# Patient Record
Sex: Female | Born: 1955 | Race: White | Hispanic: No | Marital: Married | Smoking: Never smoker
Health system: Southern US, Community
[De-identification: ages and names within clinical notes are randomized; demographics above are authoritative.]

## PROBLEM LIST (undated history)

## (undated) DIAGNOSIS — N3090 Cystitis, unspecified without hematuria: Secondary | ICD-10-CM

## (undated) DIAGNOSIS — Z860100 Personal history of colon polyps, unspecified: Secondary | ICD-10-CM

## (undated) DIAGNOSIS — N63 Unspecified lump in unspecified breast: Secondary | ICD-10-CM

## (undated) DIAGNOSIS — I10 Essential (primary) hypertension: Secondary | ICD-10-CM

## (undated) DIAGNOSIS — E669 Obesity, unspecified: Secondary | ICD-10-CM

## (undated) DIAGNOSIS — Z853 Personal history of malignant neoplasm of breast: Secondary | ICD-10-CM

## (undated) DIAGNOSIS — M419 Scoliosis, unspecified: Secondary | ICD-10-CM

## (undated) DIAGNOSIS — C50919 Malignant neoplasm of unspecified site of unspecified female breast: Secondary | ICD-10-CM

## (undated) DIAGNOSIS — Z1211 Encounter for screening for malignant neoplasm of colon: Secondary | ICD-10-CM

## (undated) HISTORY — DX: Encounter for screening for malignant neoplasm of colon: Z12.11

## (undated) HISTORY — DX: Unspecified lump in unspecified breast: N63.0

## (undated) HISTORY — DX: Personal history of colon polyps, unspecified: Z86.0100

## (undated) HISTORY — DX: Malignant neoplasm of unspecified site of unspecified female breast: C50.919

## (undated) HISTORY — PX: MASTECTOMY: SHX3

## (undated) HISTORY — DX: Cystitis, unspecified without hematuria: N30.90

## (undated) HISTORY — DX: Obesity, unspecified: E66.9

## (undated) HISTORY — PX: BACK SURGERY: SHX140

## (undated) HISTORY — DX: Personal history of malignant neoplasm of breast: Z85.3

## (undated) HISTORY — DX: Scoliosis, unspecified: M41.9

## (undated) HISTORY — DX: Personal history of colonic polyps: Z86.010

## (undated) HISTORY — PX: TOE SURGERY: SHX1073

## (undated) HISTORY — DX: Essential (primary) hypertension: I10

---

## 1996-11-10 HISTORY — PX: ABDOMINAL HYSTERECTOMY: SHX81

## 2006-05-07 ENCOUNTER — Ambulatory Visit: Payer: Self-pay | Admitting: Family Medicine

## 2006-06-22 ENCOUNTER — Ambulatory Visit: Payer: Self-pay | Admitting: Gastroenterology

## 2007-02-15 ENCOUNTER — Ambulatory Visit: Payer: Self-pay | Admitting: Family Medicine

## 2008-04-17 ENCOUNTER — Ambulatory Visit: Payer: Self-pay | Admitting: Family Medicine

## 2008-06-28 ENCOUNTER — Ambulatory Visit: Payer: Self-pay

## 2008-11-10 HISTORY — PX: COLONOSCOPY: SHX174

## 2008-11-10 HISTORY — PX: OTHER SURGICAL HISTORY: SHX169

## 2009-05-30 ENCOUNTER — Ambulatory Visit: Payer: Self-pay | Admitting: Gastroenterology

## 2009-06-08 ENCOUNTER — Ambulatory Visit: Payer: Self-pay | Admitting: Family Medicine

## 2010-10-26 ENCOUNTER — Ambulatory Visit: Payer: Self-pay | Admitting: Internal Medicine

## 2010-11-10 DIAGNOSIS — N3090 Cystitis, unspecified without hematuria: Secondary | ICD-10-CM

## 2010-11-10 DIAGNOSIS — C50919 Malignant neoplasm of unspecified site of unspecified female breast: Secondary | ICD-10-CM

## 2010-11-10 HISTORY — DX: Malignant neoplasm of unspecified site of unspecified female breast: C50.919

## 2010-11-10 HISTORY — PX: PORTACATH PLACEMENT: SHX2246

## 2010-11-10 HISTORY — DX: Cystitis, unspecified without hematuria: N30.90

## 2010-11-10 HISTORY — PX: OTHER SURGICAL HISTORY: SHX169

## 2010-12-24 ENCOUNTER — Ambulatory Visit: Payer: Self-pay | Admitting: *Deleted

## 2011-06-11 ENCOUNTER — Ambulatory Visit: Payer: Self-pay | Admitting: Oncology

## 2011-07-02 ENCOUNTER — Ambulatory Visit: Payer: Self-pay | Admitting: *Deleted

## 2011-07-16 ENCOUNTER — Ambulatory Visit: Payer: Self-pay | Admitting: Oncology

## 2011-07-21 ENCOUNTER — Ambulatory Visit: Payer: Self-pay | Admitting: Oncology

## 2011-07-25 ENCOUNTER — Ambulatory Visit: Payer: Self-pay | Admitting: General Surgery

## 2011-08-11 ENCOUNTER — Ambulatory Visit: Payer: Self-pay | Admitting: Oncology

## 2011-09-11 ENCOUNTER — Ambulatory Visit: Payer: Self-pay | Admitting: Oncology

## 2011-10-11 ENCOUNTER — Ambulatory Visit: Payer: Self-pay | Admitting: Oncology

## 2011-11-11 ENCOUNTER — Ambulatory Visit: Payer: Self-pay | Admitting: Oncology

## 2011-11-14 LAB — CBC CANCER CENTER
Basophil #: 0 x10 3/mm (ref 0.0–0.1)
Eosinophil %: 2.1 %
HCT: 31.8 % — ABNORMAL LOW (ref 35.0–47.0)
Lymphocyte #: 0.7 x10 3/mm — ABNORMAL LOW (ref 1.0–3.6)
MCHC: 34.1 g/dL (ref 32.0–36.0)
MCV: 93.3 fL (ref 80–100)
Monocyte #: 0.5 x10 3/mm (ref 0.0–0.7)
Monocyte %: 8.1 %
Neutrophil #: 4.6 x10 3/mm (ref 1.4–6.5)
Platelet: 280 x10 3/mm (ref 150–440)
RBC: 3.41 10*6/uL — ABNORMAL LOW (ref 3.80–5.20)
RDW: 16.7 % — ABNORMAL HIGH (ref 11.5–14.5)
WBC: 5.9 x10 3/mm (ref 3.6–11.0)

## 2011-11-21 LAB — CBC CANCER CENTER
Basophil #: 0 x10 3/mm (ref 0.0–0.1)
Basophil %: 0.5 %
Eosinophil #: 0.1 x10 3/mm (ref 0.0–0.7)
Eosinophil %: 1.7 %
HCT: 31.5 % — ABNORMAL LOW (ref 35.0–47.0)
HGB: 10.5 g/dL — ABNORMAL LOW (ref 12.0–16.0)
MCH: 31.6 pg (ref 26.0–34.0)
MCV: 94.4 fL (ref 80–100)
Monocyte #: 0.5 x10 3/mm (ref 0.0–0.7)
Monocyte %: 8.7 %
Neutrophil #: 4.6 x10 3/mm (ref 1.4–6.5)
Platelet: 286 x10 3/mm (ref 150–440)
RBC: 3.33 10*6/uL — ABNORMAL LOW (ref 3.80–5.20)
WBC: 6.2 x10 3/mm (ref 3.6–11.0)

## 2011-11-28 LAB — CBC CANCER CENTER
Basophil #: 0 x10 3/mm (ref 0.0–0.1)
Basophil %: 0.4 %
Eosinophil #: 0.1 x10 3/mm (ref 0.0–0.7)
HCT: 32 % — ABNORMAL LOW (ref 35.0–47.0)
HGB: 10.9 g/dL — ABNORMAL LOW (ref 12.0–16.0)
Lymphocyte %: 16.6 %
MCH: 32.1 pg (ref 26.0–34.0)
MCHC: 33.9 g/dL (ref 32.0–36.0)
Monocyte #: 0.5 x10 3/mm (ref 0.0–0.7)
Monocyte %: 8 %
Neutrophil #: 5 x10 3/mm (ref 1.4–6.5)
Platelet: 279 x10 3/mm (ref 150–440)
RBC: 3.39 10*6/uL — ABNORMAL LOW (ref 3.80–5.20)
RDW: 16.6 % — ABNORMAL HIGH (ref 11.5–14.5)

## 2011-12-05 LAB — CBC CANCER CENTER
Basophil #: 0 x10 3/mm (ref 0.0–0.1)
Basophil %: 0.3 %
Eosinophil %: 1.1 %
HGB: 10.5 g/dL — ABNORMAL LOW (ref 12.0–16.0)
Lymphocyte #: 0.7 x10 3/mm — ABNORMAL LOW (ref 1.0–3.6)
Lymphocyte %: 11.4 %
MCHC: 34 g/dL (ref 32.0–36.0)
Monocyte #: 0.4 x10 3/mm (ref 0.0–0.7)
Neutrophil %: 80.7 %
RDW: 16.5 % — ABNORMAL HIGH (ref 11.5–14.5)

## 2011-12-12 ENCOUNTER — Ambulatory Visit: Payer: Self-pay | Admitting: Oncology

## 2011-12-12 LAB — CBC CANCER CENTER
Basophil #: 0 x10 3/mm (ref 0.0–0.1)
Basophil %: 0.6 %
HCT: 32.5 % — ABNORMAL LOW (ref 35.0–47.0)
HGB: 10.9 g/dL — ABNORMAL LOW (ref 12.0–16.0)
Lymphocyte #: 0.8 x10 3/mm — ABNORMAL LOW (ref 1.0–3.6)
Lymphocyte %: 13.3 %
MCH: 31.5 pg (ref 26.0–34.0)
MCHC: 33.4 g/dL (ref 32.0–36.0)
Monocyte #: 0.5 x10 3/mm (ref 0.0–0.7)
Monocyte %: 8.7 %
Neutrophil #: 4.9 x10 3/mm (ref 1.4–6.5)
Platelet: 274 x10 3/mm (ref 150–440)
RBC: 3.45 10*6/uL — ABNORMAL LOW (ref 3.80–5.20)
RDW: 17.1 % — ABNORMAL HIGH (ref 11.5–14.5)
WBC: 6.3 x10 3/mm (ref 3.6–11.0)

## 2011-12-19 LAB — CBC CANCER CENTER
Basophil #: 0 x10 3/mm (ref 0.0–0.1)
Basophil %: 0.5 %
HCT: 34.2 % — ABNORMAL LOW (ref 35.0–47.0)
HGB: 11.4 g/dL — ABNORMAL LOW (ref 12.0–16.0)
MCH: 31.3 pg (ref 26.0–34.0)
MCHC: 33.3 g/dL (ref 32.0–36.0)
Neutrophil #: 4.8 x10 3/mm (ref 1.4–6.5)
RDW: 16.9 % — ABNORMAL HIGH (ref 11.5–14.5)

## 2012-01-09 ENCOUNTER — Ambulatory Visit: Payer: Self-pay | Admitting: Oncology

## 2012-01-13 ENCOUNTER — Ambulatory Visit: Payer: Self-pay | Admitting: Unknown Physician Specialty

## 2012-01-19 ENCOUNTER — Ambulatory Visit: Payer: Self-pay | Admitting: General Surgery

## 2012-01-21 LAB — PATHOLOGY REPORT

## 2012-02-09 ENCOUNTER — Ambulatory Visit: Payer: Self-pay | Admitting: Oncology

## 2012-02-20 LAB — CBC CANCER CENTER
Basophil #: 0 x10 3/mm (ref 0.0–0.1)
Basophil %: 0.7 %
Eosinophil %: 1.9 %
HGB: 11.1 g/dL — ABNORMAL LOW (ref 12.0–16.0)
Lymphocyte #: 1.2 x10 3/mm (ref 1.0–3.6)
Lymphocyte %: 18.4 %
MCH: 30.8 pg (ref 26.0–34.0)
MCHC: 33.6 g/dL (ref 32.0–36.0)
MCV: 92 fL (ref 80–100)
Monocyte %: 6.5 %
Platelet: 215 x10 3/mm (ref 150–440)
RDW: 15.5 % — ABNORMAL HIGH (ref 11.5–14.5)
WBC: 6.6 x10 3/mm (ref 3.6–11.0)

## 2012-02-20 LAB — COMPREHENSIVE METABOLIC PANEL WITH GFR
Albumin: 3.6 g/dL
Alkaline Phosphatase: 98 U/L
Anion Gap: 9
BUN: 18 mg/dL
Bilirubin,Total: 0.5 mg/dL
Calcium, Total: 9 mg/dL
Chloride: 106 mmol/L
Co2: 26 mmol/L
Creatinine: 0.8 mg/dL
EGFR (African American): >60
EGFR (Non-African Amer.): >60
Glucose: 114 mg/dL — ABNORMAL HIGH
Osmolality: 284
Potassium: 3.6 mmol/L
SGOT(AST): 25 U/L
SGPT (ALT): 35 U/L
Sodium: 141 mmol/L
Total Protein: 7 g/dL

## 2012-03-10 ENCOUNTER — Ambulatory Visit: Payer: Self-pay | Admitting: Oncology

## 2012-03-10 ENCOUNTER — Ambulatory Visit: Payer: Self-pay | Admitting: Radiation Oncology

## 2012-03-11 LAB — CBC CANCER CENTER
Basophil #: 0.1 x10 3/mm (ref 0.0–0.1)
Basophil %: 0.8 %
Eosinophil #: 0.1 x10 3/mm (ref 0.0–0.7)
Eosinophil %: 1.8 %
HCT: 38.2 % (ref 35.0–47.0)
HGB: 12.5 g/dL (ref 12.0–16.0)
Lymphocyte #: 0.9 x10 3/mm — ABNORMAL LOW (ref 1.0–3.6)
Lymphocyte %: 12.7 %
MCH: 29.9 pg (ref 26.0–34.0)
MCHC: 32.7 g/dL (ref 32.0–36.0)
MCV: 92 fL (ref 80–100)
Monocyte #: 0.6 x10 3/mm (ref 0.2–0.9)
Monocyte %: 8.4 %
Neutrophil #: 5.3 x10 3/mm (ref 1.4–6.5)
Neutrophil %: 76.3 %
Platelet: 225 x10 3/mm (ref 150–440)
RBC: 4.17 10*6/uL (ref 3.80–5.20)
RDW: 15 % — ABNORMAL HIGH (ref 11.5–14.5)
WBC: 7 x10 3/mm (ref 3.6–11.0)

## 2012-03-18 LAB — CBC CANCER CENTER
Basophil %: 0.8 %
Eosinophil #: 0.1 x10 3/mm (ref 0.0–0.7)
Eosinophil %: 1.4 %
HGB: 12.3 g/dL (ref 12.0–16.0)
Lymphocyte #: 0.7 x10 3/mm — ABNORMAL LOW (ref 1.0–3.6)
Lymphocyte %: 9.4 %
MCH: 29.8 pg (ref 26.0–34.0)
MCHC: 32.4 g/dL (ref 32.0–36.0)
Monocyte #: 0.6 x10 3/mm (ref 0.2–0.9)
Monocyte %: 7.8 %
Neutrophil %: 80.6 %
Platelet: 231 x10 3/mm (ref 150–440)
RDW: 14.8 % — ABNORMAL HIGH (ref 11.5–14.5)

## 2012-03-25 LAB — CBC CANCER CENTER
Basophil #: 0 x10 3/mm (ref 0.0–0.1)
Basophil %: 0.4 %
Eosinophil #: 0.1 x10 3/mm (ref 0.0–0.7)
Eosinophil %: 1.8 %
Lymphocyte %: 9.4 %
MCV: 91 fL (ref 80–100)
Monocyte %: 8.9 %
Neutrophil #: 4.9 x10 3/mm (ref 1.4–6.5)
Platelet: 192 x10 3/mm (ref 150–440)
RBC: 4 10*6/uL (ref 3.80–5.20)

## 2012-04-01 LAB — CBC CANCER CENTER
Basophil #: 0 x10 3/mm (ref 0.0–0.1)
HCT: 35.9 % (ref 35.0–47.0)
HGB: 11.8 g/dL — ABNORMAL LOW (ref 12.0–16.0)
Lymphocyte #: 0.3 x10 3/mm — ABNORMAL LOW (ref 1.0–3.6)
MCH: 29.8 pg (ref 26.0–34.0)
MCHC: 32.9 g/dL (ref 32.0–36.0)
MCV: 91 fL (ref 80–100)
Neutrophil #: 3.7 x10 3/mm (ref 1.4–6.5)
Neutrophil %: 77.5 %
RBC: 3.96 10*6/uL (ref 3.80–5.20)

## 2012-04-08 LAB — CBC CANCER CENTER
Basophil #: 0 x10 3/mm (ref 0.0–0.1)
Eosinophil %: 1.4 %
HCT: 36 % (ref 35.0–47.0)
HGB: 11.8 g/dL — ABNORMAL LOW (ref 12.0–16.0)
MCH: 29.8 pg (ref 26.0–34.0)
Neutrophil %: 74.9 %
RBC: 3.96 10*6/uL (ref 3.80–5.20)
RDW: 15.8 % — ABNORMAL HIGH (ref 11.5–14.5)
WBC: 5.7 x10 3/mm (ref 3.6–11.0)

## 2012-04-10 ENCOUNTER — Ambulatory Visit: Payer: Self-pay | Admitting: Oncology

## 2012-04-10 ENCOUNTER — Ambulatory Visit: Payer: Self-pay | Admitting: Radiation Oncology

## 2012-04-15 LAB — CBC CANCER CENTER
Basophil %: 1.8 %
Eosinophil #: 0.1 x10 3/mm (ref 0.0–0.7)
Eosinophil %: 1.2 %
HCT: 36.9 % (ref 35.0–47.0)
Lymphocyte #: 0.4 x10 3/mm — ABNORMAL LOW (ref 1.0–3.6)
MCV: 91 fL (ref 80–100)
Neutrophil #: 4.5 x10 3/mm (ref 1.4–6.5)
Neutrophil %: 80.2 %
Platelet: 181 x10 3/mm (ref 150–440)
RBC: 4.07 10*6/uL (ref 3.80–5.20)
RDW: 15.8 % — ABNORMAL HIGH (ref 11.5–14.5)
WBC: 5.6 x10 3/mm (ref 3.6–11.0)

## 2012-04-22 LAB — CBC CANCER CENTER
Basophil #: 0 x10 3/mm (ref 0.0–0.1)
Eosinophil #: 0.1 x10 3/mm (ref 0.0–0.7)
HCT: 35.9 % (ref 35.0–47.0)
Lymphocyte #: 0.4 x10 3/mm — ABNORMAL LOW (ref 1.0–3.6)
Lymphocyte %: 7.9 %
MCHC: 32.8 g/dL (ref 32.0–36.0)
MCV: 90 fL (ref 80–100)
Neutrophil %: 79.7 %
Platelet: 180 x10 3/mm (ref 150–440)
RBC: 3.97 10*6/uL (ref 3.80–5.20)
RDW: 15.8 % — ABNORMAL HIGH (ref 11.5–14.5)
WBC: 4.9 x10 3/mm (ref 3.6–11.0)

## 2012-05-10 ENCOUNTER — Ambulatory Visit: Payer: Self-pay | Admitting: Radiation Oncology

## 2012-05-10 ENCOUNTER — Ambulatory Visit: Payer: Self-pay | Admitting: Oncology

## 2012-05-21 LAB — COMPREHENSIVE METABOLIC PANEL
Anion Gap: 7 (ref 7–16)
Calcium, Total: 9.2 mg/dL (ref 8.5–10.1)
Chloride: 104 mmol/L (ref 98–107)
EGFR (African American): >60
EGFR (Non-African Amer.): >60
Potassium: 3.7 mmol/L (ref 3.5–5.1)
SGOT(AST): 22 U/L (ref 15–37)
SGPT (ALT): 32 U/L

## 2012-05-21 LAB — CBC CANCER CENTER
Basophil #: 0 x10 3/mm (ref 0.0–0.1)
Eosinophil #: 0.1 x10 3/mm (ref 0.0–0.7)
Eosinophil %: 1.4 %
HCT: 35 % (ref 35.0–47.0)
Lymphocyte %: 10.1 %
Monocyte #: 0.5 x10 3/mm (ref 0.2–0.9)
Neutrophil %: 80.9 %
RDW: 16.4 % — ABNORMAL HIGH (ref 11.5–14.5)
WBC: 6.2 x10 3/mm (ref 3.6–11.0)

## 2012-05-22 LAB — CANCER ANTIGEN 27.29: CA 27.29: 13 U/mL (ref 0.0–38.6)

## 2012-06-10 ENCOUNTER — Ambulatory Visit: Payer: Self-pay | Admitting: Oncology

## 2012-07-06 ENCOUNTER — Ambulatory Visit: Payer: Self-pay | Admitting: General Surgery

## 2012-07-22 ENCOUNTER — Ambulatory Visit: Payer: Self-pay | Admitting: General Surgery

## 2012-07-26 ENCOUNTER — Ambulatory Visit: Payer: Self-pay | Admitting: Oncology

## 2012-08-04 ENCOUNTER — Ambulatory Visit: Payer: Self-pay | Admitting: General Surgery

## 2012-08-10 ENCOUNTER — Ambulatory Visit: Payer: Self-pay | Admitting: Oncology

## 2012-08-20 LAB — COMPREHENSIVE METABOLIC PANEL
Albumin: 3.5 g/dL (ref 3.4–5.0)
Alkaline Phosphatase: 98 U/L (ref 50–136)
BUN: 18 mg/dL (ref 7–18)
Bilirubin,Total: 0.5 mg/dL (ref 0.2–1.0)
Calcium, Total: 9.3 mg/dL (ref 8.5–10.1)
Chloride: 105 mmol/L (ref 98–107)
Co2: 30 mmol/L (ref 21–32)
Creatinine: 0.75 mg/dL (ref 0.60–1.30)
EGFR (African American): >60
EGFR (Non-African Amer.): >60
Osmolality: 280 (ref 275–301)
SGOT(AST): 20 U/L (ref 15–37)
SGPT (ALT): 33 U/L (ref 12–78)

## 2012-08-20 LAB — CBC CANCER CENTER
Basophil #: 0 x10 3/mm (ref 0.0–0.1)
Eosinophil #: 0.1 x10 3/mm (ref 0.0–0.7)
Eosinophil %: 1.3 %
Lymphocyte #: 0.6 x10 3/mm — ABNORMAL LOW (ref 1.0–3.6)
MCH: 32.2 pg (ref 26.0–34.0)
MCHC: 33.8 g/dL (ref 32.0–36.0)
MCV: 95 fL (ref 80–100)
Monocyte #: 0.5 x10 3/mm (ref 0.2–0.9)
Neutrophil %: 77.9 %
Platelet: 206 x10 3/mm (ref 150–440)
RDW: 14.8 % — ABNORMAL HIGH (ref 11.5–14.5)

## 2012-08-23 LAB — CANCER ANTIGEN 27.29: CA 27.29: 11.1 U/mL (ref 0.0–38.6)

## 2012-09-10 ENCOUNTER — Ambulatory Visit: Payer: Self-pay | Admitting: Oncology

## 2012-10-21 ENCOUNTER — Ambulatory Visit: Payer: Self-pay | Admitting: Oncology

## 2012-11-10 ENCOUNTER — Ambulatory Visit: Payer: Self-pay | Admitting: Oncology

## 2012-12-11 ENCOUNTER — Ambulatory Visit: Payer: Self-pay | Admitting: Oncology

## 2012-12-17 LAB — COMPREHENSIVE METABOLIC PANEL
Albumin: 3.5 g/dL (ref 3.4–5.0)
Alkaline Phosphatase: 116 U/L (ref 50–136)
Anion Gap: 7 (ref 7–16)
Bilirubin,Total: 0.5 mg/dL (ref 0.2–1.0)
Chloride: 105 mmol/L (ref 98–107)
EGFR (African American): >60
Glucose: 135 mg/dL — ABNORMAL HIGH (ref 65–99)
Osmolality: 284 (ref 275–301)
SGOT(AST): 20 U/L (ref 15–37)
Total Protein: 7 g/dL (ref 6.4–8.2)

## 2012-12-17 LAB — CBC CANCER CENTER
Basophil #: 0 x10 3/mm (ref 0.0–0.1)
Eosinophil #: 0.1 x10 3/mm (ref 0.0–0.7)
Eosinophil %: 2.4 %
HCT: 36.8 % (ref 35.0–47.0)
Lymphocyte #: 0.7 x10 3/mm — ABNORMAL LOW (ref 1.0–3.6)
MCH: 31.4 pg (ref 26.0–34.0)
MCHC: 34 g/dL (ref 32.0–36.0)
Monocyte %: 6.7 %
Neutrophil %: 78.5 %
Platelet: 205 x10 3/mm (ref 150–440)
RDW: 14.4 % (ref 11.5–14.5)

## 2012-12-18 ENCOUNTER — Encounter: Payer: Self-pay | Admitting: *Deleted

## 2012-12-18 DIAGNOSIS — E669 Obesity, unspecified: Secondary | ICD-10-CM | POA: Insufficient documentation

## 2012-12-18 DIAGNOSIS — M419 Scoliosis, unspecified: Secondary | ICD-10-CM | POA: Insufficient documentation

## 2012-12-18 DIAGNOSIS — N3090 Cystitis, unspecified without hematuria: Secondary | ICD-10-CM | POA: Insufficient documentation

## 2012-12-18 DIAGNOSIS — C50919 Malignant neoplasm of unspecified site of unspecified female breast: Secondary | ICD-10-CM | POA: Insufficient documentation

## 2012-12-18 DIAGNOSIS — I10 Essential (primary) hypertension: Secondary | ICD-10-CM | POA: Insufficient documentation

## 2012-12-18 DIAGNOSIS — N63 Unspecified lump in unspecified breast: Secondary | ICD-10-CM | POA: Insufficient documentation

## 2012-12-18 DIAGNOSIS — Z853 Personal history of malignant neoplasm of breast: Secondary | ICD-10-CM | POA: Insufficient documentation

## 2012-12-18 DIAGNOSIS — Z1211 Encounter for screening for malignant neoplasm of colon: Secondary | ICD-10-CM | POA: Insufficient documentation

## 2013-01-08 ENCOUNTER — Ambulatory Visit: Payer: Self-pay | Admitting: Oncology

## 2013-03-07 ENCOUNTER — Ambulatory Visit (INDEPENDENT_AMBULATORY_CARE_PROVIDER_SITE_OTHER): Payer: 59 | Admitting: General Surgery

## 2013-03-07 ENCOUNTER — Encounter: Payer: Self-pay | Admitting: General Surgery

## 2013-03-07 ENCOUNTER — Ambulatory Visit: Payer: Self-pay | Admitting: General Surgery

## 2013-03-07 ENCOUNTER — Other Ambulatory Visit: Payer: Self-pay | Admitting: *Deleted

## 2013-03-07 VITALS — BP 122/80 | HR 78 | Resp 18 | Ht 64.0 in | Wt 178.0 lb

## 2013-03-07 DIAGNOSIS — Z853 Personal history of malignant neoplasm of breast: Secondary | ICD-10-CM

## 2013-03-07 NOTE — Progress Notes (Signed)
The patient has been asked to return to the office in five months for a unilateral left breast diagnostic mammogram.

## 2013-03-07 NOTE — Progress Notes (Signed)
Patient ID: Melanie Jacobs, female   DOB: [birth year], 57 y.o.   MRN: [id number]  Chief Complaint  Patient presents with  . Follow-up    left breast ultrasound    HPI Melanie Jacobs is a 57 y.o. female who presents for a follow up left breast ultrasound. At prior visit the patient found a small mass located in the left breast described as pea size. She was diagnosed with right breast cancer in Sept 2012 and was treated with Right mastectomy , chemotherapy and radiation by Dr. Doylene Canning and Dr.Chrystal. She performs self breast exams and get regular mammograms done. No new problems with the breasts. She still has a little tenderness in the left breast. She states she has not noticed the small mass gettingsmaller since her last office visit. No known family history of breast problems.   HPI  Past Medical History  Diagnosis Date  . Breast cancer 2012    right breast  . Malignant neoplasm of breast (female), unspecified site   . Unspecified essential hypertension   . Cystitis 2012  . Personal history of malignant neoplasm of breast   . Lump or mass in breast   . Special screening for malignant neoplasms, colon   . Obesity, unspecified   . Personal history of colonic polyps   . Scoliosis     Past Surgical History  Procedure Laterality Date  . Abdominal hysterectomy  1998  . Colonoscopy  2010    Dr/ Niel Hummer  . Mastectomy Right   . Right breast biopsy   2012  . Portacath placement  2012  . Toe surgery      age 49 bilateral  . Back surgery  [phone]    due to scoliosis  . Arm surgery  2010    right arm due to nerve damage    Family History  Problem Relation Age of Onset  . Lung cancer Father     Social History History  Substance Use Topics  . Smoking status: Never Smoker   . Smokeless tobacco: Not on file  . Alcohol Use: No    No Known Allergies  Current Outpatient Prescriptions  Medication Sig Dispense Refill  . Calcium Carbonate-Vitamin D (CALTRATE 600+D PO) Take by  mouth.      . docusate sodium (COLACE) 100 MG capsule Take 100 mg by mouth as needed for constipation.      Marland Kitchen exemestane (AROMASIN) 25 MG tablet Take 25 mg by mouth daily.      . fish oil-omega-3 fatty acids 1000 MG capsule Take 1 g by mouth daily.      Marland Kitchen FLUoxetine (PROZAC) 20 MG capsule Take 20 mg by mouth 3 (three) times daily.      Marland Kitchen HYDROcodone-acetaminophen (VICODIN) 5-500 MG per tablet Take 1 tablet by mouth as needed for pain.      Marland Kitchen ibuprofen (ADVIL,MOTRIN) 200 MG tablet Take 200 mg by mouth every 6 (six) hours as needed for pain.      Marland Kitchen Omeprazole (PRILOSEC PO) Take 2 capsules by mouth daily.        No current facility-administered medications for this visit.    Review of Systems Review of Systems  Constitutional: Negative.   Respiratory: Negative.   Cardiovascular: Negative.     Blood pressure 122/80, pulse 78, resp. rate 18, height 5\' 4"  (1.626 m), weight 178 lb (80.74 kg), last menstrual period 11/10/1996.  Physical Exam Physical Exam  Constitutional: She appears well-developed and well-nourished.  Pulmonary/Chest: Left breast exhibits no  inverted nipple, no mass, no nipple discharge, no skin change and no tenderness.    RIght mastectomy site well healed.   Lymphadenopathy:    She has no cervical adenopathy.    She has no axillary adenopathy.    Data Reviewed nil  Assessment    Resolved left breast mass     Plan    Return to scheduled f/u        SANKAR,SEEPLAPUTHUR G 03/07/2013, 8:04 PM

## 2013-03-07 NOTE — Patient Instructions (Addendum)
Left breast diagnostic mammogram in September 2014.

## 2013-04-19 ENCOUNTER — Ambulatory Visit: Payer: Self-pay | Admitting: Oncology

## 2013-04-26 ENCOUNTER — Emergency Department: Payer: Self-pay | Admitting: Emergency Medicine

## 2013-04-26 ENCOUNTER — Ambulatory Visit: Payer: Self-pay | Admitting: Radiation Oncology

## 2013-04-26 LAB — URINALYSIS, COMPLETE
Glucose,UR: NEGATIVE mg/dL (ref 0–75)
Ketone: NEGATIVE
Nitrite: NEGATIVE
RBC,UR: 1 /HPF (ref 0–5)
Specific Gravity: 1.025 (ref 1.003–1.030)
WBC UR: 6 /HPF (ref 0–5)

## 2013-04-26 LAB — CBC
HCT: 38.5 % (ref 35.0–47.0)
MCH: 32.1 pg (ref 26.0–34.0)
MCV: 94 fL (ref 80–100)
Platelet: 223 10*3/uL (ref 150–440)
RBC: 4.09 10*6/uL (ref 3.80–5.20)
RDW: 16.2 % — ABNORMAL HIGH (ref 11.5–14.5)

## 2013-04-26 LAB — COMPREHENSIVE METABOLIC PANEL
Anion Gap: 8 (ref 7–16)
BUN: 26 mg/dL — ABNORMAL HIGH (ref 7–18)
Bilirubin,Total: 2.6 mg/dL — ABNORMAL HIGH (ref 0.2–1.0)
Calcium, Total: 9.8 mg/dL (ref 8.5–10.1)
Chloride: 103 mmol/L (ref 98–107)
Co2: 25 mmol/L (ref 21–32)
Glucose: 87 mg/dL (ref 65–99)
Potassium: 4.3 mmol/L (ref 3.5–5.1)
SGOT(AST): 291 U/L — ABNORMAL HIGH (ref 15–37)
Sodium: 136 mmol/L (ref 136–145)
Total Protein: 7.6 g/dL (ref 6.4–8.2)

## 2013-05-06 LAB — CBC CANCER CENTER
Basophil %: 2.8 %
Eosinophil #: 0 x10 3/mm (ref 0.0–0.7)
Eosinophil %: 0.3 %
HCT: 34 % — ABNORMAL LOW (ref 35.0–47.0)
Lymphocyte #: 0.5 x10 3/mm — ABNORMAL LOW (ref 1.0–3.6)
MCH: 32.3 pg (ref 26.0–34.0)
MCHC: 34.3 g/dL (ref 32.0–36.0)
MCV: 94 fL (ref 80–100)
Monocyte %: 27.7 %
Platelet: 137 x10 3/mm — ABNORMAL LOW (ref 150–440)
WBC: 1.1 x10 3/mm — CL (ref 3.6–11.0)

## 2013-05-06 LAB — COMPREHENSIVE METABOLIC PANEL
Albumin: 3 g/dL — ABNORMAL LOW (ref 3.4–5.0)
Alkaline Phosphatase: 200 U/L — ABNORMAL HIGH (ref 50–136)
Anion Gap: 13 (ref 7–16)
Bilirubin,Total: 1.3 mg/dL — ABNORMAL HIGH (ref 0.2–1.0)
Calcium, Total: 8.9 mg/dL (ref 8.5–10.1)
Creatinine: 0.8 mg/dL (ref 0.60–1.30)
EGFR (African American): >60
EGFR (Non-African Amer.): >60
Glucose: 153 mg/dL — ABNORMAL HIGH (ref 65–99)
Osmolality: 277 (ref 275–301)
Potassium: 4 mmol/L (ref 3.5–5.1)
SGOT(AST): 331 U/L — ABNORMAL HIGH (ref 15–37)
SGPT (ALT): 113 U/L — ABNORMAL HIGH (ref 12–78)
Sodium: 137 mmol/L (ref 136–145)
Total Protein: 7.2 g/dL (ref 6.4–8.2)

## 2013-05-10 ENCOUNTER — Ambulatory Visit: Payer: Self-pay | Admitting: Oncology

## 2013-06-06 ENCOUNTER — Telehealth: Payer: Self-pay | Admitting: General Surgery

## 2013-06-06 NOTE — Telephone Encounter (Signed)
PATIENT CAME IN TODAY NEEDING A PRESCRIPTION FOR PROSTHESIS & BRA'S.PLEASE SEND TO [facility].

## 2013-06-09 LAB — CBC CANCER CENTER
Basophil #: 0 x10 3/mm (ref 0.0–0.1)
Eosinophil #: 0 x10 3/mm (ref 0.0–0.7)
HCT: 32.3 % — ABNORMAL LOW (ref 35.0–47.0)
HGB: 11.4 g/dL — ABNORMAL LOW (ref 12.0–16.0)
Lymphocyte %: 13.2 %
MCH: 34.8 pg — ABNORMAL HIGH (ref 26.0–34.0)
MCHC: 35.4 g/dL (ref 32.0–36.0)
Monocyte #: 0.3 x10 3/mm (ref 0.2–0.9)
Monocyte %: 5.9 %
Neutrophil #: 3.9 x10 3/mm (ref 1.4–6.5)
Neutrophil %: 80.1 %
Platelet: 119 x10 3/mm — ABNORMAL LOW (ref 150–440)
RDW: 18.3 % — ABNORMAL HIGH (ref 11.5–14.5)
WBC: 4.8 x10 3/mm (ref 3.6–11.0)

## 2013-06-09 LAB — COMPREHENSIVE METABOLIC PANEL
Alkaline Phosphatase: 120 U/L (ref 50–136)
Chloride: 104 mmol/L (ref 98–107)
Co2: 28 mmol/L (ref 21–32)
EGFR (Non-African Amer.): >60
Potassium: 3.9 mmol/L (ref 3.5–5.1)
Sodium: 139 mmol/L (ref 136–145)

## 2013-06-10 ENCOUNTER — Ambulatory Visit: Payer: Self-pay | Admitting: Oncology

## 2013-06-24 LAB — COMPREHENSIVE METABOLIC PANEL
Albumin: 3.3 g/dL — ABNORMAL LOW (ref 3.4–5.0)
BUN: 18 mg/dL (ref 7–18)
Calcium, Total: 8.7 mg/dL (ref 8.5–10.1)
Chloride: 106 mmol/L (ref 98–107)
Co2: 25 mmol/L (ref 21–32)
Creatinine: 0.71 mg/dL (ref 0.60–1.30)
EGFR (African American): >60
EGFR (Non-African Amer.): >60
Glucose: 125 mg/dL — ABNORMAL HIGH (ref 65–99)
Potassium: 4.2 mmol/L (ref 3.5–5.1)
SGPT (ALT): 44 U/L (ref 12–78)
Total Protein: 6.9 g/dL (ref 6.4–8.2)

## 2013-06-24 LAB — CBC CANCER CENTER
Eosinophil #: 0 x10 3/mm (ref 0.0–0.7)
HCT: 33.4 % — ABNORMAL LOW (ref 35.0–47.0)
HGB: 11.5 g/dL — ABNORMAL LOW (ref 12.0–16.0)
Lymphocyte #: 0.5 x10 3/mm — ABNORMAL LOW (ref 1.0–3.6)
Lymphocyte %: 8.1 %
MCHC: 34.4 g/dL (ref 32.0–36.0)
MCV: 100 fL (ref 80–100)
Monocyte #: 0.4 x10 3/mm (ref 0.2–0.9)
Monocyte %: 5.5 %
Neutrophil #: 5.4 x10 3/mm (ref 1.4–6.5)
Platelet: 123 x10 3/mm — ABNORMAL LOW (ref 150–440)
RBC: 3.35 10*6/uL — ABNORMAL LOW (ref 3.80–5.20)
RDW: 19.3 % — ABNORMAL HIGH (ref 11.5–14.5)

## 2013-07-01 LAB — CBC CANCER CENTER
Basophil #: 0.1 x10 3/mm (ref 0.0–0.1)
Eosinophil %: 0.1 %
HCT: 33.2 % — ABNORMAL LOW (ref 35.0–47.0)
Lymphocyte #: 0.9 x10 3/mm — ABNORMAL LOW (ref 1.0–3.6)
Lymphocyte %: 10.3 %
MCH: 34.1 pg — ABNORMAL HIGH (ref 26.0–34.0)
MCHC: 34.1 g/dL (ref 32.0–36.0)
MCV: 100 fL (ref 80–100)
Monocyte #: 1.2 x10 3/mm — ABNORMAL HIGH (ref 0.2–0.9)
Platelet: 89 x10 3/mm — ABNORMAL LOW (ref 150–440)
RBC: 3.32 10*6/uL — ABNORMAL LOW (ref 3.80–5.20)

## 2013-07-11 ENCOUNTER — Ambulatory Visit: Payer: Self-pay | Admitting: Oncology

## 2013-07-11 ENCOUNTER — Ambulatory Visit: Payer: Self-pay | Admitting: *Deleted

## 2013-07-27 ENCOUNTER — Ambulatory Visit: Payer: 59 | Admitting: General Surgery

## 2013-07-27 ENCOUNTER — Ambulatory Visit: Payer: Self-pay | Admitting: General Surgery

## 2013-07-28 ENCOUNTER — Encounter: Payer: Self-pay | Admitting: General Surgery

## 2013-08-02 NOTE — Progress Notes (Signed)
Quick Note:  Make sure the additional views are done prior to her office visit ______ 

## 2013-08-04 ENCOUNTER — Ambulatory Visit: Payer: 59 | Admitting: General Surgery

## 2013-08-08 ENCOUNTER — Ambulatory Visit: Payer: 59 | Admitting: General Surgery

## 2013-08-09 ENCOUNTER — Telehealth: Payer: Self-pay | Admitting: General Surgery

## 2013-08-09 NOTE — Telephone Encounter (Signed)
Pt had decided not to have additional views of her left breast. She now has liver mets and is undergoing further treatment.  At present there is no need for further assessment of her left breast.  Please inform mammography about this. Also she does not need f/u appt here at present.

## 2013-08-10 ENCOUNTER — Ambulatory Visit: Payer: Self-pay | Admitting: Oncology

## 2013-08-18 ENCOUNTER — Ambulatory Visit: Payer: 59 | Admitting: General Surgery

## 2013-09-10 ENCOUNTER — Ambulatory Visit: Payer: Self-pay | Admitting: Oncology

## 2013-10-10 ENCOUNTER — Ambulatory Visit: Payer: Self-pay | Admitting: Oncology

## 2013-10-21 LAB — CBC CANCER CENTER
Basophil %: 0.6 %
Eosinophil %: 2.3 %
HCT: 30.3 % — ABNORMAL LOW (ref 35.0–47.0)
HGB: 10.5 g/dL — ABNORMAL LOW (ref 12.0–16.0)
Lymphocyte #: 0.6 x10 3/mm — ABNORMAL LOW (ref 1.0–3.6)
MCH: 40.6 pg — ABNORMAL HIGH (ref 26.0–34.0)
MCHC: 34.6 g/dL (ref 32.0–36.0)
Monocyte #: 0.5 x10 3/mm (ref 0.2–0.9)
Monocyte %: 9.8 %
RBC: 2.58 10*6/uL — ABNORMAL LOW (ref 3.80–5.20)
RDW: 22.3 % — ABNORMAL HIGH (ref 11.5–14.5)

## 2013-10-21 LAB — COMPREHENSIVE METABOLIC PANEL
Albumin: 3.6 g/dL (ref 3.4–5.0)
Alkaline Phosphatase: 62 U/L
Anion Gap: 8 (ref 7–16)
BUN: 14 mg/dL (ref 7–18)
Calcium, Total: 8.8 mg/dL (ref 8.5–10.1)
Creatinine: 0.65 mg/dL (ref 0.60–1.30)
EGFR (Non-African Amer.): >60
SGPT (ALT): 41 U/L (ref 12–78)

## 2013-11-10 ENCOUNTER — Ambulatory Visit: Payer: Self-pay | Admitting: Oncology

## 2013-11-10 ENCOUNTER — Ambulatory Visit: Payer: Self-pay | Admitting: *Deleted

## 2013-11-25 LAB — COMPREHENSIVE METABOLIC PANEL
ALK PHOS: 58 U/L
ALT: 40 U/L (ref 12–78)
ANION GAP: 7 (ref 7–16)
Albumin: 3.8 g/dL (ref 3.4–5.0)
BUN: 19 mg/dL — ABNORMAL HIGH (ref 7–18)
Bilirubin,Total: 1.3 mg/dL — ABNORMAL HIGH (ref 0.2–1.0)
CHLORIDE: 102 mmol/L (ref 98–107)
CO2: 29 mmol/L (ref 21–32)
Calcium, Total: 8.9 mg/dL (ref 8.5–10.1)
Creatinine: 0.76 mg/dL (ref 0.60–1.30)
EGFR (African American): >60
EGFR (Non-African Amer.): >60
Glucose: 97 mg/dL (ref 65–99)
Osmolality: 278 (ref 275–301)
POTASSIUM: 4.3 mmol/L (ref 3.5–5.1)
SGOT(AST): 32 U/L (ref 15–37)
Sodium: 138 mmol/L (ref 136–145)
Total Protein: 7.2 g/dL (ref 6.4–8.2)

## 2013-11-25 LAB — CBC CANCER CENTER
BASOS PCT: 0.7 %
Basophil #: 0 x10 3/mm (ref 0.0–0.1)
Eosinophil #: 0.1 x10 3/mm (ref 0.0–0.7)
Eosinophil %: 1.8 %
HCT: 30.6 % — AB (ref 35.0–47.0)
HGB: 10.6 g/dL — ABNORMAL LOW (ref 12.0–16.0)
Lymphocyte #: 0.8 x10 3/mm — ABNORMAL LOW (ref 1.0–3.6)
Lymphocyte %: 17.5 %
MCH: 41.8 pg — AB (ref 26.0–34.0)
MCHC: 34.6 g/dL (ref 32.0–36.0)
MCV: 121 fL — ABNORMAL HIGH (ref 80–100)
MONO ABS: 0.5 x10 3/mm (ref 0.2–0.9)
MONOS PCT: 9.6 %
Neutrophil #: 3.4 x10 3/mm (ref 1.4–6.5)
Neutrophil %: 70.4 %
Platelet: 116 x10 3/mm — ABNORMAL LOW (ref 150–440)
RBC: 2.53 10*6/uL — ABNORMAL LOW (ref 3.80–5.20)
RDW: 17.8 % — AB (ref 11.5–14.5)
WBC: 4.8 x10 3/mm (ref 3.6–11.0)

## 2013-12-11 ENCOUNTER — Ambulatory Visit: Payer: Self-pay | Admitting: Oncology

## 2014-01-08 ENCOUNTER — Ambulatory Visit: Payer: Self-pay | Admitting: *Deleted

## 2014-01-25 ENCOUNTER — Ambulatory Visit: Payer: Self-pay | Admitting: Oncology

## 2014-02-13 ENCOUNTER — Ambulatory Visit: Payer: Self-pay | Admitting: Oncology

## 2014-02-13 LAB — CBC CANCER CENTER
BASOS ABS: 0 x10 3/mm (ref 0.0–0.1)
BASOS PCT: 1.1 %
EOS ABS: 0 x10 3/mm (ref 0.0–0.7)
EOS PCT: 1.1 %
HCT: 34.2 % — ABNORMAL LOW (ref 35.0–47.0)
HGB: 11.4 g/dL — ABNORMAL LOW (ref 12.0–16.0)
Lymphocyte #: 0.6 x10 3/mm — ABNORMAL LOW (ref 1.0–3.6)
Lymphocyte %: 28.3 %
MCH: 40 pg — ABNORMAL HIGH (ref 26.0–34.0)
MCHC: 33.4 g/dL (ref 32.0–36.0)
MCV: 120 fL — ABNORMAL HIGH (ref 80–100)
Monocyte #: 0.2 x10 3/mm (ref 0.2–0.9)
Monocyte %: 9.2 %
Neutrophil #: 1.2 x10 3/mm — ABNORMAL LOW (ref 1.4–6.5)
Neutrophil %: 60.3 %
PLATELETS: 45 x10 3/mm — AB (ref 150–440)
RBC: 2.86 10*6/uL — ABNORMAL LOW (ref 3.80–5.20)
RDW: 14.3 % (ref 11.5–14.5)
WBC: 2 x10 3/mm — AB (ref 3.6–11.0)

## 2014-02-20 LAB — CBC CANCER CENTER
BASOS PCT: 0.8 %
Basophil #: 0 x10 3/mm (ref 0.0–0.1)
Eosinophil #: 0 x10 3/mm (ref 0.0–0.7)
Eosinophil %: 0.9 %
HCT: 37.4 % (ref 35.0–47.0)
HGB: 12.5 g/dL (ref 12.0–16.0)
LYMPHS ABS: 0.7 x10 3/mm — AB (ref 1.0–3.6)
Lymphocyte %: 23.9 %
MCH: 39.2 pg — AB (ref 26.0–34.0)
MCHC: 33.3 g/dL (ref 32.0–36.0)
MCV: 118 fL — AB (ref 80–100)
Monocyte #: 0.4 x10 3/mm (ref 0.2–0.9)
Monocyte %: 13.7 %
NEUTROS PCT: 60.7 %
Neutrophil #: 1.8 x10 3/mm (ref 1.4–6.5)
Platelet: 95 x10 3/mm — ABNORMAL LOW (ref 150–440)
RBC: 3.19 10*6/uL — ABNORMAL LOW (ref 3.80–5.20)
RDW: 13.4 % (ref 11.5–14.5)
WBC: 3 x10 3/mm — AB (ref 3.6–11.0)

## 2014-03-10 ENCOUNTER — Ambulatory Visit: Payer: Self-pay | Admitting: Oncology

## 2014-03-23 LAB — CBC CANCER CENTER
BASOS PCT: 1.3 %
Basophil #: 0 x10 3/mm (ref 0.0–0.1)
EOS PCT: 0.7 %
Eosinophil #: 0 x10 3/mm (ref 0.0–0.7)
HCT: 35.7 % (ref 35.0–47.0)
HGB: 12.4 g/dL (ref 12.0–16.0)
Lymphocyte #: 0.6 x10 3/mm — ABNORMAL LOW (ref 1.0–3.6)
Lymphocyte %: 23.5 %
MCH: 38.5 pg — AB (ref 26.0–34.0)
MCHC: 34.7 g/dL (ref 32.0–36.0)
MCV: 111 fL — ABNORMAL HIGH (ref 80–100)
MONOS PCT: 7.8 %
Monocyte #: 0.2 x10 3/mm (ref 0.2–0.9)
Neutrophil #: 1.7 x10 3/mm (ref 1.4–6.5)
Neutrophil %: 66.7 %
PLATELETS: 41 x10 3/mm — AB (ref 150–440)
RBC: 3.21 10*6/uL — ABNORMAL LOW (ref 3.80–5.20)
RDW: 14.2 % (ref 11.5–14.5)
WBC: 2.5 x10 3/mm — AB (ref 3.6–11.0)

## 2014-03-30 LAB — PLATELET COUNT: Platelet: 77 10*3/uL — ABNORMAL LOW (ref 150–440)

## 2014-04-06 LAB — CANCER CTR PLATELET CT: PLATELETS: 134 x10 3/mm — AB (ref 150–440)

## 2014-04-10 ENCOUNTER — Ambulatory Visit: Payer: Self-pay | Admitting: Oncology

## 2014-04-10 ENCOUNTER — Ambulatory Visit: Payer: Self-pay | Admitting: Physician Assistant

## 2014-04-12 ENCOUNTER — Ambulatory Visit: Payer: Self-pay | Admitting: Physician Assistant

## 2014-04-12 LAB — CBC WITH DIFFERENTIAL/PLATELET
Basophil #: 0 10*3/uL (ref 0.0–0.1)
Basophil %: 0.5 %
EOS ABS: 0.1 10*3/uL (ref 0.0–0.7)
Eosinophil %: 0.9 %
HCT: 39.4 % (ref 35.0–47.0)
HGB: 13.4 g/dL (ref 12.0–16.0)
LYMPHS PCT: 9.1 %
Lymphocyte #: 0.7 10*3/uL — ABNORMAL LOW (ref 1.0–3.6)
MCH: 36.7 pg — ABNORMAL HIGH (ref 26.0–34.0)
MCHC: 33.9 g/dL (ref 32.0–36.0)
MCV: 108 fL — AB (ref 80–100)
MONOS PCT: 3.5 %
Monocyte #: 0.2 x10 3/mm (ref 0.2–0.9)
NEUTROS ABS: 6.1 10*3/uL (ref 1.4–6.5)
Neutrophil %: 86 %
Platelet: 164 10*3/uL (ref 150–440)
RBC: 3.64 10*6/uL — ABNORMAL LOW (ref 3.80–5.20)
RDW: 13.7 % (ref 11.5–14.5)
WBC: 7.1 10*3/uL (ref 3.6–11.0)

## 2014-04-14 ENCOUNTER — Ambulatory Visit: Payer: Self-pay | Admitting: Physician Assistant

## 2014-04-16 LAB — WOUND CULTURE

## 2014-04-21 LAB — CBC CANCER CENTER
BASOS PCT: 0.8 %
Basophil #: 0 x10 3/mm (ref 0.0–0.1)
Eosinophil #: 0 x10 3/mm (ref 0.0–0.7)
Eosinophil %: 1.3 %
HCT: 35.1 % (ref 35.0–47.0)
HGB: 12 g/dL (ref 12.0–16.0)
LYMPHS ABS: 0.6 x10 3/mm — AB (ref 1.0–3.6)
Lymphocyte %: 16.7 %
MCH: 36.5 pg — ABNORMAL HIGH (ref 26.0–34.0)
MCHC: 34.3 g/dL (ref 32.0–36.0)
MCV: 106 fL — ABNORMAL HIGH (ref 80–100)
Monocyte #: 0.2 x10 3/mm (ref 0.2–0.9)
Monocyte %: 4.4 %
Neutrophil #: 2.7 x10 3/mm (ref 1.4–6.5)
Neutrophil %: 76.8 %
PLATELETS: 94 x10 3/mm — AB (ref 150–440)
RBC: 3.29 10*6/uL — AB (ref 3.80–5.20)
RDW: 13.8 % (ref 11.5–14.5)
WBC: 3.5 x10 3/mm — ABNORMAL LOW (ref 3.6–11.0)

## 2014-04-21 LAB — COMPREHENSIVE METABOLIC PANEL
ALBUMIN: 3.8 g/dL (ref 3.4–5.0)
ALK PHOS: 66 U/L
ANION GAP: 8 (ref 7–16)
AST: 36 U/L (ref 15–37)
BUN: 20 mg/dL — ABNORMAL HIGH (ref 7–18)
Bilirubin,Total: 0.8 mg/dL (ref 0.2–1.0)
CALCIUM: 9.4 mg/dL (ref 8.5–10.1)
CHLORIDE: 103 mmol/L (ref 98–107)
Co2: 28 mmol/L (ref 21–32)
Creatinine: 1.08 mg/dL (ref 0.60–1.30)
EGFR (Non-African Amer.): 57 — ABNORMAL LOW
GLUCOSE: 108 mg/dL — AB (ref 65–99)
Osmolality: 281 (ref 275–301)
POTASSIUM: 4.6 mmol/L (ref 3.5–5.1)
SGPT (ALT): 61 U/L (ref 12–78)
Sodium: 139 mmol/L (ref 136–145)
Total Protein: 7.5 g/dL (ref 6.4–8.2)

## 2014-05-10 ENCOUNTER — Ambulatory Visit: Payer: Self-pay | Admitting: Oncology

## 2014-05-18 LAB — CBC CANCER CENTER
Basophil #: 0 x10 3/mm (ref 0.0–0.1)
Basophil %: 1.3 %
EOS ABS: 0 x10 3/mm (ref 0.0–0.7)
Eosinophil %: 1.1 %
HCT: 34.5 % — AB (ref 35.0–47.0)
HGB: 11.9 g/dL — ABNORMAL LOW (ref 12.0–16.0)
LYMPHS PCT: 20 %
Lymphocyte #: 0.5 x10 3/mm — ABNORMAL LOW (ref 1.0–3.6)
MCH: 38 pg — ABNORMAL HIGH (ref 26.0–34.0)
MCHC: 34.3 g/dL (ref 32.0–36.0)
MCV: 111 fL — AB (ref 80–100)
MONO ABS: 0.2 x10 3/mm (ref 0.2–0.9)
MONOS PCT: 6.4 %
NEUTROS ABS: 1.8 x10 3/mm (ref 1.4–6.5)
Neutrophil %: 71.2 %
PLATELETS: 104 x10 3/mm — AB (ref 150–440)
RBC: 3.12 10*6/uL — ABNORMAL LOW (ref 3.80–5.20)
RDW: 17 % — AB (ref 11.5–14.5)
WBC: 2.5 x10 3/mm — ABNORMAL LOW (ref 3.6–11.0)

## 2014-05-18 LAB — COMPREHENSIVE METABOLIC PANEL
Albumin: 4 g/dL (ref 3.4–5.0)
Alkaline Phosphatase: 56 U/L
Anion Gap: 6 — ABNORMAL LOW (ref 7–16)
BILIRUBIN TOTAL: 0.8 mg/dL (ref 0.2–1.0)
BUN: 24 mg/dL — ABNORMAL HIGH (ref 7–18)
CALCIUM: 9.4 mg/dL (ref 8.5–10.1)
CO2: 32 mmol/L (ref 21–32)
Chloride: 102 mmol/L (ref 98–107)
Creatinine: 0.98 mg/dL (ref 0.60–1.30)
EGFR (African American): >60
Glucose: 85 mg/dL (ref 65–99)
Osmolality: 283 (ref 275–301)
POTASSIUM: 4.5 mmol/L (ref 3.5–5.1)
SGOT(AST): 29 U/L (ref 15–37)
SGPT (ALT): 42 U/L (ref 12–78)
Sodium: 140 mmol/L (ref 136–145)
TOTAL PROTEIN: 7.7 g/dL (ref 6.4–8.2)

## 2014-06-10 ENCOUNTER — Ambulatory Visit: Payer: Self-pay | Admitting: Oncology

## 2014-07-20 ENCOUNTER — Ambulatory Visit: Payer: Self-pay

## 2014-07-20 ENCOUNTER — Ambulatory Visit: Payer: Self-pay | Admitting: Internal Medicine

## 2014-07-20 LAB — COMPREHENSIVE METABOLIC PANEL
ALBUMIN: 3.3 g/dL — AB (ref 3.4–5.0)
ALT: 119 U/L — AB
AST: 79 U/L — AB (ref 15–37)
Alkaline Phosphatase: 86 U/L
Anion Gap: 9 (ref 7–16)
BILIRUBIN TOTAL: 1.3 mg/dL — AB (ref 0.2–1.0)
BUN: 31 mg/dL — ABNORMAL HIGH (ref 7–18)
CALCIUM: 8.9 mg/dL (ref 8.5–10.1)
CHLORIDE: 102 mmol/L (ref 98–107)
CREATININE: 0.95 mg/dL (ref 0.60–1.30)
Co2: 26 mmol/L (ref 21–32)
EGFR (African American): >60
EGFR (Non-African Amer.): >60
Glucose: 141 mg/dL — ABNORMAL HIGH (ref 65–99)
OSMOLALITY: 283 (ref 275–301)
Potassium: 4 mmol/L (ref 3.5–5.1)
Sodium: 137 mmol/L (ref 136–145)
Total Protein: 6.8 g/dL (ref 6.4–8.2)

## 2014-07-20 LAB — CBC WITH DIFFERENTIAL/PLATELET
Basophil #: 0 10*3/uL (ref 0.0–0.1)
Basophil %: 0.3 %
Eosinophil #: 0 10*3/uL (ref 0.0–0.7)
Eosinophil %: 0.3 %
HCT: 32.5 % — ABNORMAL LOW (ref 35.0–47.0)
HGB: 10.9 g/dL — AB (ref 12.0–16.0)
Lymphocyte #: 1.3 10*3/uL (ref 1.0–3.6)
Lymphocyte %: 10.2 %
MCH: 37.3 pg — AB (ref 26.0–34.0)
MCHC: 33.5 g/dL (ref 32.0–36.0)
MCV: 111 fL — ABNORMAL HIGH (ref 80–100)
MONOS PCT: 4.3 %
Monocyte #: 0.5 x10 3/mm (ref 0.2–0.9)
Neutrophil #: 10.7 10*3/uL — ABNORMAL HIGH (ref 1.4–6.5)
Neutrophil %: 84.9 %
Platelet: 154 10*3/uL (ref 150–440)
RBC: 2.91 10*6/uL — AB (ref 3.80–5.20)
RDW: 19.1 % — ABNORMAL HIGH (ref 11.5–14.5)
WBC: 12.6 10*3/uL — ABNORMAL HIGH (ref 3.6–11.0)

## 2014-07-20 LAB — MAGNESIUM: Magnesium: 2.2 mg/dL

## 2014-07-21 LAB — CANCER ANTIGEN 15-3: Cancer Antigen-Breast 15-3: 306.2 U/mL — ABNORMAL HIGH (ref 0.0–25.0)

## 2014-07-27 LAB — COMPREHENSIVE METABOLIC PANEL
ALK PHOS: 84 U/L
AST: 85 U/L — AB (ref 15–37)
Albumin: 3.3 g/dL — ABNORMAL LOW (ref 3.4–5.0)
Anion Gap: 9 (ref 7–16)
BUN: 23 mg/dL — ABNORMAL HIGH (ref 7–18)
Bilirubin,Total: 1.8 mg/dL — ABNORMAL HIGH (ref 0.2–1.0)
CALCIUM: 9.1 mg/dL (ref 8.5–10.1)
CHLORIDE: 101 mmol/L (ref 98–107)
Co2: 26 mmol/L (ref 21–32)
Creatinine: 0.89 mg/dL (ref 0.60–1.30)
EGFR (African American): >60
EGFR (Non-African Amer.): >60
GLUCOSE: 169 mg/dL — AB (ref 65–99)
OSMOLALITY: 280 (ref 275–301)
Potassium: 3.5 mmol/L (ref 3.5–5.1)
SGPT (ALT): 112 U/L — ABNORMAL HIGH
SODIUM: 136 mmol/L (ref 136–145)
TOTAL PROTEIN: 6.9 g/dL (ref 6.4–8.2)

## 2014-07-27 LAB — CBC CANCER CENTER
COMMENT - H1-COM5: NORMAL
EOS PCT: 1 %
HCT: 31.3 % — AB (ref 35.0–47.0)
HGB: 10.6 g/dL — ABNORMAL LOW (ref 12.0–16.0)
Lymphocytes: 8 %
MCH: 38 pg — ABNORMAL HIGH (ref 26.0–34.0)
MCHC: 33.8 g/dL (ref 32.0–36.0)
MCV: 113 fL — ABNORMAL HIGH (ref 80–100)
Monocytes: 1 %
Platelet: 145 x10 3/mm — ABNORMAL LOW (ref 150–440)
RBC: 2.78 10*6/uL — ABNORMAL LOW (ref 3.80–5.20)
RDW: 19.7 % — AB (ref 11.5–14.5)
Segmented Neutrophils: 90 %
WBC: 7.9 x10 3/mm (ref 3.6–11.0)

## 2014-07-27 LAB — BILIRUBIN, DIRECT: BILIRUBIN DIRECT: 0.5 mg/dL — AB (ref 0.00–0.20)

## 2014-08-10 ENCOUNTER — Ambulatory Visit: Payer: Self-pay | Admitting: Internal Medicine

## 2014-08-24 ENCOUNTER — Ambulatory Visit: Payer: Self-pay

## 2014-08-24 LAB — COMPREHENSIVE METABOLIC PANEL
ALBUMIN: 2.4 g/dL — AB (ref 3.4–5.0)
ALT: 135 U/L — AB
ANION GAP: 9 (ref 7–16)
Alkaline Phosphatase: 168 U/L — ABNORMAL HIGH
BUN: 15 mg/dL (ref 7–18)
Bilirubin,Total: 2.4 mg/dL — ABNORMAL HIGH (ref 0.2–1.0)
Calcium, Total: 8.5 mg/dL (ref 8.5–10.1)
Chloride: 97 mmol/L — ABNORMAL LOW (ref 98–107)
Co2: 27 mmol/L (ref 21–32)
Creatinine: 0.85 mg/dL (ref 0.60–1.30)
EGFR (African American): >60
Glucose: 85 mg/dL (ref 65–99)
Osmolality: 266 (ref 275–301)
Potassium: 3.5 mmol/L (ref 3.5–5.1)
SGOT(AST): 135 U/L — ABNORMAL HIGH (ref 15–37)
Sodium: 133 mmol/L — ABNORMAL LOW (ref 136–145)
TOTAL PROTEIN: 6.5 g/dL (ref 6.4–8.2)

## 2014-08-24 LAB — CBC WITH DIFFERENTIAL/PLATELET
Basophil #: 0 10*3/uL (ref 0.0–0.1)
Basophil %: 0.5 %
EOS ABS: 0 10*3/uL (ref 0.0–0.7)
EOS PCT: 0.4 %
HCT: 28.5 % — AB (ref 35.0–47.0)
HGB: 9.6 g/dL — ABNORMAL LOW (ref 12.0–16.0)
LYMPHS PCT: 10.8 %
Lymphocyte #: 0.8 10*3/uL — ABNORMAL LOW (ref 1.0–3.6)
MCH: 38.3 pg — AB (ref 26.0–34.0)
MCHC: 33.6 g/dL (ref 32.0–36.0)
MCV: 114 fL — ABNORMAL HIGH (ref 80–100)
Monocyte #: 0.7 x10 3/mm (ref 0.2–0.9)
Monocyte %: 10.3 %
Neutrophil #: 5.4 10*3/uL (ref 1.4–6.5)
Neutrophil %: 78 %
Platelet: 123 10*3/uL — ABNORMAL LOW (ref 150–440)
RBC: 2.49 10*6/uL — AB (ref 3.80–5.20)
RDW: 21.6 % — AB (ref 11.5–14.5)
WBC: 7 10*3/uL (ref 3.6–11.0)

## 2014-08-24 LAB — BILIRUBIN, DIRECT: Bilirubin, Direct: 1.4 mg/dL — ABNORMAL HIGH (ref 0.00–0.20)

## 2014-08-31 ENCOUNTER — Ambulatory Visit: Payer: Self-pay

## 2014-08-31 LAB — COMPREHENSIVE METABOLIC PANEL
ANION GAP: 7 (ref 7–16)
Albumin: 2.4 g/dL — ABNORMAL LOW (ref 3.4–5.0)
Alkaline Phosphatase: 183 U/L — ABNORMAL HIGH
BUN: 16 mg/dL (ref 7–18)
Bilirubin,Total: 2.8 mg/dL — ABNORMAL HIGH (ref 0.2–1.0)
CALCIUM: 8.7 mg/dL (ref 8.5–10.1)
CHLORIDE: 100 mmol/L (ref 98–107)
CREATININE: 0.91 mg/dL (ref 0.60–1.30)
Co2: 26 mmol/L (ref 21–32)
EGFR (Non-African Amer.): >60
Glucose: 125 mg/dL — ABNORMAL HIGH (ref 65–99)
Osmolality: 269 (ref 275–301)
POTASSIUM: 4.1 mmol/L (ref 3.5–5.1)
SGOT(AST): 216 U/L — ABNORMAL HIGH (ref 15–37)
SGPT (ALT): 84 U/L — ABNORMAL HIGH
SODIUM: 133 mmol/L — AB (ref 136–145)
Total Protein: 6.3 g/dL — ABNORMAL LOW (ref 6.4–8.2)

## 2014-08-31 LAB — CBC WITH DIFFERENTIAL/PLATELET
Basophil #: 0 10*3/uL (ref 0.0–0.1)
Basophil %: 0.6 %
EOS PCT: 0.4 %
Eosinophil #: 0 10*3/uL (ref 0.0–0.7)
HCT: 27.8 % — ABNORMAL LOW (ref 35.0–47.0)
HGB: 9.4 g/dL — ABNORMAL LOW (ref 12.0–16.0)
LYMPHS PCT: 7 %
Lymphocyte #: 0.5 10*3/uL — ABNORMAL LOW (ref 1.0–3.6)
MCH: 39.6 pg — AB (ref 26.0–34.0)
MCHC: 33.9 g/dL (ref 32.0–36.0)
MCV: 117 fL — ABNORMAL HIGH (ref 80–100)
MONOS PCT: 5 %
Monocyte #: 0.4 x10 3/mm (ref 0.2–0.9)
Neutrophil #: 6.6 10*3/uL — ABNORMAL HIGH (ref 1.4–6.5)
Neutrophil %: 87 %
Platelet: 73 10*3/uL — ABNORMAL LOW (ref 150–440)
RBC: 2.38 10*6/uL — ABNORMAL LOW (ref 3.80–5.20)
RDW: 22.7 % — AB (ref 11.5–14.5)
WBC: 7.6 10*3/uL (ref 3.6–11.0)

## 2014-09-01 ENCOUNTER — Ambulatory Visit: Payer: Self-pay | Admitting: Oncology

## 2014-09-01 LAB — PROTIME-INR
INR: 1.2
Prothrombin Time: 14.8 secs — ABNORMAL HIGH (ref 11.5–14.7)

## 2014-09-01 LAB — APTT: ACTIVATED PTT: 28.1 s (ref 23.6–35.9)

## 2014-09-04 LAB — COMPREHENSIVE METABOLIC PANEL
Albumin: 2.6 g/dL — ABNORMAL LOW (ref 3.4–5.0)
Alkaline Phosphatase: 212 U/L — ABNORMAL HIGH
Anion Gap: 6 — ABNORMAL LOW (ref 7–16)
BUN: 11 mg/dL (ref 7–18)
Bilirubin,Total: 3 mg/dL — ABNORMAL HIGH (ref 0.2–1.0)
Calcium, Total: 8.4 mg/dL — ABNORMAL LOW (ref 8.5–10.1)
Chloride: 102 mmol/L (ref 98–107)
Co2: 28 mmol/L (ref 21–32)
Creatinine: 0.84 mg/dL (ref 0.60–1.30)
EGFR (African American): >60
EGFR (Non-African Amer.): >60
Glucose: 102 mg/dL — ABNORMAL HIGH (ref 65–99)
Osmolality: 272 (ref 275–301)
Potassium: 4 mmol/L (ref 3.5–5.1)
SGOT(AST): 292 U/L — ABNORMAL HIGH (ref 15–37)
SGPT (ALT): 86 U/L — ABNORMAL HIGH
Sodium: 136 mmol/L (ref 136–145)
Total Protein: 6.5 g/dL (ref 6.4–8.2)

## 2014-09-04 LAB — CBC CANCER CENTER
Basophil #: 0.1 x10 3/mm (ref 0.0–0.1)
Basophil %: 1.1 %
Eosinophil #: 0 x10 3/mm (ref 0.0–0.7)
Eosinophil %: 0.7 %
HCT: 28.6 % — ABNORMAL LOW (ref 35.0–47.0)
HGB: 9.1 g/dL — ABNORMAL LOW (ref 12.0–16.0)
Lymphocyte #: 0.7 x10 3/mm — ABNORMAL LOW (ref 1.0–3.6)
Lymphocyte %: 10.8 %
MCH: 38.6 pg — ABNORMAL HIGH (ref 26.0–34.0)
MCHC: 31.7 g/dL — ABNORMAL LOW (ref 32.0–36.0)
MCV: 122 fL — ABNORMAL HIGH (ref 80–100)
Monocyte #: 0.4 x10 3/mm (ref 0.2–0.9)
Monocyte %: 5.9 %
Neutrophil #: 5.1 x10 3/mm (ref 1.4–6.5)
Neutrophil %: 81.5 %
Platelet: 47 x10 3/mm — ABNORMAL LOW (ref 150–440)
RBC: 2.35 10*6/uL — ABNORMAL LOW (ref 3.80–5.20)
RDW: 22.8 % — ABNORMAL HIGH (ref 11.5–14.5)
WBC: 6.2 x10 3/mm (ref 3.6–11.0)

## 2014-09-08 LAB — CBC CANCER CENTER
Basophil #: 0.1 x10 3/mm (ref 0.0–0.1)
Basophil %: 1.4 %
EOS PCT: 0.6 %
Eosinophil #: 0 x10 3/mm (ref 0.0–0.7)
HCT: 27.2 % — ABNORMAL LOW (ref 35.0–47.0)
HGB: 9 g/dL — AB (ref 12.0–16.0)
Lymphocyte #: 0.4 x10 3/mm — ABNORMAL LOW (ref 1.0–3.6)
Lymphocyte %: 8.3 %
MCH: 40.3 pg — ABNORMAL HIGH (ref 26.0–34.0)
MCHC: 33.1 g/dL (ref 32.0–36.0)
MCV: 122 fL — ABNORMAL HIGH (ref 80–100)
Monocyte #: 0.3 x10 3/mm (ref 0.2–0.9)
Monocyte %: 6.9 %
NEUTROS ABS: 3.9 x10 3/mm (ref 1.4–6.5)
NEUTROS PCT: 82.8 %
PLATELETS: 39 x10 3/mm — AB (ref 150–440)
RBC: 2.24 10*6/uL — ABNORMAL LOW (ref 3.80–5.20)
RDW: 22.7 % — ABNORMAL HIGH (ref 11.5–14.5)
WBC: 4.7 x10 3/mm (ref 3.6–11.0)

## 2014-09-10 ENCOUNTER — Ambulatory Visit: Payer: Self-pay | Admitting: Internal Medicine

## 2014-09-11 ENCOUNTER — Encounter: Payer: Self-pay | Admitting: General Surgery

## 2014-09-12 LAB — CBC CANCER CENTER
BASOS ABS: 0.1 x10 3/mm (ref 0.0–0.1)
BASOS PCT: 1.2 %
Eosinophil #: 0.1 x10 3/mm (ref 0.0–0.7)
Eosinophil %: 1.4 %
HCT: 27.7 % — ABNORMAL LOW (ref 35.0–47.0)
HGB: 9.3 g/dL — ABNORMAL LOW (ref 12.0–16.0)
LYMPHS PCT: 10.9 %
Lymphocyte #: 0.6 x10 3/mm — ABNORMAL LOW (ref 1.0–3.6)
MCH: 40.7 pg — AB (ref 26.0–34.0)
MCHC: 33.6 g/dL (ref 32.0–36.0)
MCV: 121 fL — AB (ref 80–100)
MONO ABS: 0.3 x10 3/mm (ref 0.2–0.9)
Monocyte %: 6.1 %
NEUTROS PCT: 80.4 %
Neutrophil #: 4.6 x10 3/mm (ref 1.4–6.5)
PLATELETS: 50 x10 3/mm — AB (ref 150–440)
RBC: 2.29 10*6/uL — ABNORMAL LOW (ref 3.80–5.20)
RDW: 22.5 % — AB (ref 11.5–14.5)
WBC: 5.7 x10 3/mm (ref 3.6–11.0)

## 2014-09-15 LAB — CBC CANCER CENTER
BASOS PCT: 1 %
Basophil #: 0.1 x10 3/mm (ref 0.0–0.1)
Eosinophil #: 0.1 x10 3/mm (ref 0.0–0.7)
Eosinophil %: 0.8 %
HCT: 29.6 % — ABNORMAL LOW (ref 35.0–47.0)
HGB: 9.8 g/dL — AB (ref 12.0–16.0)
Lymphocyte #: 0.7 x10 3/mm — ABNORMAL LOW (ref 1.0–3.6)
Lymphocyte %: 10.5 %
MCH: 40.2 pg — ABNORMAL HIGH (ref 26.0–34.0)
MCHC: 33.1 g/dL (ref 32.0–36.0)
MCV: 121 fL — ABNORMAL HIGH (ref 80–100)
MONOS PCT: 6.4 %
Monocyte #: 0.4 x10 3/mm (ref 0.2–0.9)
NEUTROS ABS: 5.3 x10 3/mm (ref 1.4–6.5)
Neutrophil %: 81.3 %
Platelet: 75 x10 3/mm — ABNORMAL LOW (ref 150–440)
RBC: 2.44 10*6/uL — ABNORMAL LOW (ref 3.80–5.20)
RDW: 22 % — AB (ref 11.5–14.5)
WBC: 6.5 x10 3/mm (ref 3.6–11.0)

## 2014-10-10 ENCOUNTER — Ambulatory Visit: Payer: Self-pay | Admitting: Internal Medicine

## 2014-10-10 ENCOUNTER — Ambulatory Visit: Payer: Self-pay | Admitting: Oncology

## 2014-10-10 DEATH — deceased

## 2015-03-04 NOTE — Op Note (Signed)
PATIENT NAME:  Melanie Jacobs, Melanie Jacobs MR#:  [id number] DATE OF BIRTH:  15-Jan-1956  DATE OF PROCEDURE:  01/19/2012  PREOPERATIVE DIAGNOSIS: Carcinoma of the right breast.   POSTOPERATIVE DIAGNOSIS: Carcinoma of the right breast.   OPERATION: Right modified radical mastectomy.   SURGEON: Mckinley Jewel, MD   ANESTHESIA: General.   COMPLICATIONS: None.   ESTIMATED BLOOD LOSS: Approximately 50 to 75 mL.   DRAINS: Blake drain.   CLINICAL NOTE: This patient was diagnosed with a fairly sizable cancer involving the right breast in the inferior portion extending from the areola down with skin dimpling involving a portion near the lower edge of the breast. She also had two positive nodes identified on prior evaluation. Chemotherapy was undertaken with excellent response. She now had no palpable mass in the breast nor was there any palpable lymph nodes at this time. Mastectomy was decided after discussion with the patient in full. In view of the known positive nodes, it was decided to proceed with axillary dissection at the same time.   DESCRIPTION OF PROCEDURE: The patient was put to sleep in the supine position on the operating table. The right breast was prepped and draped out as a sterile field including the right axilla. An elliptical skin incision was mapped out in the medial to the lateral aspect. Care was taken to allow adequate margin inferiorly since the skin dimpling was noted just superior to the inferior crease of the breast at about the 7 o'clock position. The plasma blade was utilized. This was used to do the skin incision and flaps were then created superiorly and inferiorly with the use of a plasma blade and dissected down superiorly to the infraclavicular space and inferiorly to the upper rectus fascia. A couple of the bleeders were ligated with 3-0 Vicryl but the bulk of it was done with the use of the plasma blade with good control of bleeding. The breast tissue was then dissected along with the  pectoral fascia from the underlying pectoral muscle from the medial to the lateral aspect and along the lateral margin of the pectoralis muscle. The tissues were dissected off the lateral portion after dissection was completed leaving only the axillary attachment. Axillary dissection was then performed with the use of Harmonic scalpel and noted that there were two palpable firm nodes. Likely these were the ones that were involved in the beginning. Dissection was completed as the tissues were swept down from the axillary vein downward. Both the latissimus dorsi and the long thoracic nerve were both identified and preserved. Remaining attachments were taken down and the breast along with the axillary contents was placed in formalin for pathologic evaluation. The axillary content area was tagged so the orientation would be appropriate. After ensuring hemostasis, the wound was irrigated with some saline. A Blake drain was positioned going across the area and brought out through a stab incision inferiorly in the medial aspect. This was fastened to the skin with a nylon stitch. Interrupted 2-0 Vicryl stitches were placed in the subcutaneous tissue approximating the two flaps together. The skin was then closed with a running of 3-0 Monocryl stitch. Area was covered with Dermabond followed by placement of some 4 x 4's and ABDs and a surgical bra was then placed to complete this dressing. The procedure was well tolerated. The patient subsequently was extubated and returned to the recovery room in stable condition.  ____________________________ S.Robinette Haines, MD sgs:drc D: 01/19/2012 14:51:10 ET T: 01/19/2012 15:36:06 ET JOB#: [id number]  cc: S.G.  Jamal Collin, MD, <Dictator> [facility] Robinette Haines MD ELECTRONICALLY SIGNED 01/21/2012 16:34

## 2015-03-04 NOTE — Consult Note (Signed)
Reason for Visit: This 59 year old Female patient presents to the clinic for initial evaluation of  Breast cancer .   Referred by Dr. Oliva Bustard.  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 Core biopsy was positive for invasive mammary carcinoma ER/PR positive HER-2/neu negative. At that time was approximately 3 cm.patient went on to have a PET/CT scan showing axillary adenopathy. She went on to have neoadjuvant chemotherapy with Cytoxan Adriamycin and Taxol. She then had a right modified radical mastectomy with 2 mm of residual disease in her right breast. Tumor was nuclear grade 2 overall. For asked her lymph nodes were examined 3 had macro metastasis the largest being 6 mm. No extracapsular spread was identified. She is done well with her chemotherapy. She seen today for consideration of right chest wall and peripheral lymphatic radiation. She is having no swelling of her upper extremity. No cough hemoptysis or chest tightness.  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:    depression:    Scoliosis:    Panic Attacks:    Anxiety:    Back surgery x 2:    Herrington rods in back:    bilateral toe surgery:    hysterectomy:    colonoscopy:   Past, Family and Social History:   Past Medical History positive     Gastrointestinal GERD    Genitourinary UTIs    Neurological/Psychiatric depression    Past Surgical History Multiple back surgeries for scoliosis patient has Harrington rods placed, also hysterectomy and toe surgery    Past Medical History Comments Scoliosis    Family History positive    Family History Comments Father with lung cancer    Social History noncontributory    Additional Past Medical and Surgical History Seen by herself today   Allergies:   Cipro: Other  Home Meds:  Home Medications: Medication Instructions Status  letrozole 2.5 mg tablet 1 tab(s) orally once a day x 30 days Active  fluoxetine 20 mg oral capsule 3 cap(s) orally once a day (in the morning) Active  Vicodin 5 mg-500 mg oral tablet 1 tab(s) orally every 4 hours, As Needed Active  Fish Oil 1000 mg oral capsule 1 cap(s) orally once a day (in the morning) Active  stool softener  1 cap(s) orally 2 times a day, As Needed Active  Calcium 600+D 1 tab(s) orally 2 times a day Active  omeprazole 20 mg oral delayed release capsule 1 cap(s) orally 2 times a day Active   Review of Systems:   General negative    Performance Status (ECOG) 0    Skin negative    Breast see HPI    Ophthalmologic negative    ENMT negative    Respiratory and Thorax negative    Cardiovascular negative    Gastrointestinal see HPI  Genitourinary negative    Musculoskeletal negative    Neurological negative    Psychiatric see HPI    Hematology/Lymphatics negative    Endocrine negative    Allergic/Immunologic negative   Nursing Notes:  Nursing Vital Signs and Chemo Nursing Nursing Notes: *CC Vital Signs Flowsheet:   17-Apr-13 10:38   Temp Temperature 96.9   Pulse Pulse 92   Respirations Respirations 20   SBP SBP 111   DBP DBP 72   Current Weight (kg) (kg) 81.6   Height (cm) centimeters 160   BSA (m2) 1.8   Physical Exam:  General/Skin/HEENT:   General normal    Skin normal    Eyes normal     ENMT normal    Head and Neck normal    Additional PE Well-developed female in NAD. She is status post right modified radical mastectomy. Chest walls clear without evidence of nodularity or mass. Left breast is free of dominant mass or nodularity into position examined. No axillary or supraclavicular adenopathy is appreciated. Lungs are clear to A&P cardiac examination shows regular rate and rhythm. She has a Port-A-Cath placed in the left anterior chest wall.   Breasts/Resp/CV/GI/GU:   Respiratory and Thorax normal    Cardiovascular normal    Gastrointestinal normal    Genitourinary normal   MS/Neuro/Psych/Lymph:   Musculoskeletal normal    Neurological normal    Lymphatics normal   Other Results:  Radiology Results: [place]:    22-Aug-12 16:30, US Breast Right   US Breast Right    REASON FOR EXAM:    RT BRST MASS UNDER NIPPLE  COMMENTS:       PROCEDURE: [place]  - US BREAST RIGHT  - Jul 02 2011  4:30PM     RESULT:     FINDINGS: The right breast was evaluated in the region of interest from   the 6 o'clock to the 7 o'clock position.At the 6 o'clock position a   hypoechoic nodule is identified with increased through transmission with   slightly lobulated border. There are areas of increased peripheral flow.    At the zone of transition there appears to be primarily poor perceptible   wall. At the 7 o'clock position a smaller hypoechoic nodule is identified   similar findings. This is in the area of palpable concern corresponding   to a small nodular density with pleomorphic calcifications on radiograph.     The sonographic findings are indeterminate and the radiographic findings   are concerning.      IMPRESSION:     1.Indeterminate nodules at the 7 o'clock and 6 o'clock positions. Please   refer to the unilateral right mammographic view for completed discussion.     Thank you for the opportunity to contribute to the care of your patient.            Verified By: Mikki Santee, M.D., MD  MRI:    19-Feb-13 09:44, MRI Thoracic Spine Without Contrast   MRI Thoracic Spine Without Contrast    REASON FOR EXAM:    ATTN  T2 Vertebrae Back pain and history of breast ca  COMMENTS:       PROCEDURE: MMR - MMR THORACIC SPINE WO  - Dec 30 2011  9:44AM     RESULT: Comparison: Whole-body bone scan 12/22/2011    Technique: Standard thoracic spine protocol, without intravenous contrast.    Findings:  Evaluation is limited by the marked S-shaped scoliosis and the   susceptibility artifact from the posterior spinal  fusion hardware. There   is bone marrow edema within the superior half of the T12 vertebral body.   This is most consistent with a mild acute compression fracture. There is   less than 25% height loss. This correlates with the region of abnormal     radiotracer activity on the prior bone scan. No retropulsion of bony   fragments. The inferior half of the vertebral demonstrates normal fatty   marrow signal.     Small round focus of T2 hyperintensity in superior aspect of the T6   vertebral body demonstrates T1 hyperintensity and is most consistent with   a hemangioma. Evaluation of the spinal cord is limited by the   aforementioned artifact. No definite signal abnormality seen within the   spinal cord. No significant posterior disc bulge identified.    There is a moderate size hiatal hernia. Subcentimeter T2 hyperintense   foci in the thyroid gland are nonspecific.    IMPRESSION:   Mild, acute, benign appearing, compression fracture of the T12 vertebral     body.          Verified By: Gregor Hams, M.D., MD  [facility]:    22-Aug-12 15:04, Digital Unilateral Right Breast   Digital Unilateral Right Breast    REASON FOR EXAM:    RT BRST MASS UNDER NIPPLE  COMMENTS:       PROCEDURE: MAM - MAM DGTL UNI MAM RT BREAST W/CAD  - Jul 02 2011  3:04PM     RESULT: Comparison is made to previous studies dated 10-24-11, 05-09-09,   04-17-08, as well  as 06-02-02.    Within the superior portion of the right breast a stable nodule is   appreciated with coarse calcifications consistent with a fibroadenoma. In   the region of palpable concern, a tightly clustered area of pleomorphic   calcifications is identified. There is a vague, nodular, partially   spiculated nodular density associated with these findings. This area is   suspicious and further evaluation with surgical consultation is   recommended. This area corresponds to two small hypoechoic regions     identified on ultrasound. These solid appearing nodules have an   indeterminate appearance and are concerning particularly considering the   radiographic findings.      IMPRESSION:     1.Concerning findings in the region of palpable abnormality at the 7   o'clock position.     BI-RADS: Category 4 - Suspicious Abnormality    Surgical consultation recommended.     A NEGATIVE MAMMOGRAM REPORT DOES NOT PRECLUDE BIOPSY OR OTHER EVALUATION   OF A CLINICALLY PALPABLE OR OTHERWISE SUSPICIOUS MASS OR LESION. BREAST     CANCER MAY NOT BE DETECTED BY MAMMOGRAPHY IN UP TO 10% OF CASES.       Thank you for the opportunity to contribute to the care of your patient.            Verified By: Mikki Santee, M.D., MD  Nuclear Med:    10-Sep-12 14:06, PET/CT Scan Breast CA Stage/Restaging   PET/CT Scan Breast CA Stage/Restaging    REASON FOR EXAM:    right breast CA initial staging  COMMENTS:       PROCEDURE: PET - PET/CT RESTG BREAST CA  - Jul 21 2011  2:06PM     RESULT: The patient is undergoing initial staging of breast malignancy.   The patient had a positive right breast biopsy performed one week ago.   The patient's fasting blood  glucose level was 92 mg/dL. The patient   received 32.91 millicuries of B-16 labeled FDG at 12:10 p.m. with   scanning beginning at 1:26 p.m. A noncontrast CT scan was performed at   the same sitting for coregistration and attenuation  correction.    Uptake of the radiopharmaceutical within the neck is normal. There is a   focus of increased uptake in a right axillary lymph node which measures   5.4 SUV units maximally and 5 SUV units as a mean. This lymph node     measures 2.1 cm transversely x 1.2 cm AP. A more medial and inferiorly   positioned right axillary lymph node measures 2.2 cm AP x 1.4 cm   transversely. It exhibits a maximal SUV of 6.1 with a mean of 5.2. Within   the right breast at the surgical site there is low level increased uptake   associated with poorly marginated increased soft tissue density with a   maximal SUV of 4.1 with a mean of 3.2. This could reflect postsurgical   change, but residual disease is not entirely excluded.     Within the abdomen I see no abnormal uptake within the liver or adrenal   glands. A small amount of duodenal uptake is demonstrated. Normal   expected uptake within the kidneys and ureters and urinary bladder is   demonstrated. There is a sclerotic focus in the left lateral aspect of   the body of T12. This area exhibits an increased uptake but this is also   the site of previous stabilization with Harrington rods. This exhibits   SUV of 6.8 maximally with a mean of 4. I do not see abnormal uptake     elsewhere along the course of the Harrington rods which increases the   suspicion that the increased uptake in T12 could reflect metastatic   disease.     The CT images reveal the thyroid lobes to be symmetric in size with no   focal mass is identified. The cardiac chambers are top normal in size.   There is a large hiatal hernia-partially intrathoracic stomach. I see no   bulky mediastinal or hilar lymph nodes. There is severe thoracic   scoliosis convex toward the right. Within the abdomen and pelvis I see no   hepatic masses. No calcified gallstones are demonstrated. I see no kidney   stones. There are no adrenal masses. The spleen is not enlarged. There is   no  evidence of ascites.    IMPRESSION:   1. There is abnormal uptake within 2 enlarged right axillary lymph nodes     consistent with metastatic disease. There is also low level increased   uptake in the right breast at the surgical site which may reflect   residual disease or postsurgical change. I see no abnormal uptake within   the mediastinum or hilar regions or pulmonary parenchyma.  2. There is increased uptake associated with the body of T12 which is   worrisome for metastatic disease.          Verified By: DAVID A. Martinique, M.D., MD    11-Feb-13 13:37, Bone Scan Whole Body (Part 2 of 2)   Bone Scan Whole Body (Part 2 of 2)    REASON FOR EXAM:    Back Pain Breast CA  COMMENTS:       PROCEDURE: KNM - KNM BONE WB 3HR 2 OF 2  - Dec 22 2011  1:37PM     RESULT:  Procedure: Total-body bone scan was performed status post right   antecubital injection of 23.14 mCi of technetiumlabeled MDP. Standard   imaging was obtained as well as small field of view imaging of the   thoracolumbar spine.    Findings: Appropriate biodistribution is identified in the region of the   right and left kidneys and urinary bladder. S-shaped scoliosis is   identified within the thoracolumbar spine. An area of osseous remodeling     is appreciated diffusely involving the T12 vertebra. This appears to   primarily involve the vertebral body. Vertebral body fracture,   insufficiency versus pathologic considering the patient's history.   Alternatively, replacement of the vertebral body secondary to metastatic   disease cannot be excluded. Correlation with plain film evaluation of the   thoracic and lumbar spine is recommended.    No further regions of abnormal osseous remodeling are identified.    IMPRESSION:      1. Area of diffuse osseous remodeling which appears to primarily involve   the T12 vertebral body as described above. Correlation with plain film   evaluation is recommended. If clinically  warranted, MRI of the lumbar   and/or thoracic spine is also recommended.  Thank you for the opportunity to contribute to the care of your patient.           Verified By: Mikki Santee, M.D., MD   Assessment and Plan:  Impression:   pathologic stage II A. invasive mammary carcinoma in 59 year old female status post neoadjuvant chemotherapy and right modified radical mastectomy.  Plan:   at this time based on the limited axillary node dissection and 3 of 4 lymph nodes being positive with macro metastatic disease believe she would benefit from right chest wall and peripheral lymphatic radiation. Would plan on delivering 5000 cGy to both those areas. Would boost or scar another 1400 cGy using electron beam. Risks and benefits of treatment including skin reaction as well as fatigue and slight involvement of superficial lung were all explained in detail to the patient. She also was explained she may have some risk of lymphedema in the right upper extremity but with the limited lymph node dissection believe that risk is limited. I have set up CT simulation for her early next week.  I would like to take this opportunity to thank you for allowing me to continue to participate in this patient's care.  CC Referral:   cc: Dr. Lanier Clam, Dr. Jamal Collin Dr. Lenon Oms [facility] clinic   Electronic Signatures: Baruch Gouty, Roda Shutters (MD)  (Signed 17-Apr-13 16:17)  Authored: HPI, Diagnosis, Past Hx, PFSH, Allergies, Home Meds, ROS, Nursing Notes, Physical Exam, Other Results, Encounter Assessment and Plan, CC Referring Physician   Last Updated: 17-Apr-13 16:17 by Armstead Peaks (MD)

## 2016-03-12 IMAGING — CT CT CHEST-ABD W/ CM
2 of 5 series · 13 of 36 positions shown, 18 images · IV contrast (isovue)
Comparison: Chest CT on 07/27/2012 and abdomen CT on 04/27/2013

CLINICAL DATA: Followup metastatic breast carcinoma. Worsening
right upper quadrant pain. Elevated liver function tests.

EXAM:
CT CHEST AND ABDOMEN WITH CONTRAST
TECHNIQUE: Multidetector CT imaging of the chest and abdomen was performed
during bolus administration of intravenous contrast.
CONTRAST:  100 mL Isovue 370

[Series 2: soft tissue · axial · 0.75mm/px · z∈[-574,-204]mm · 10 of 88 slices shown, 15 images]
[im 7/88  mediastinal]
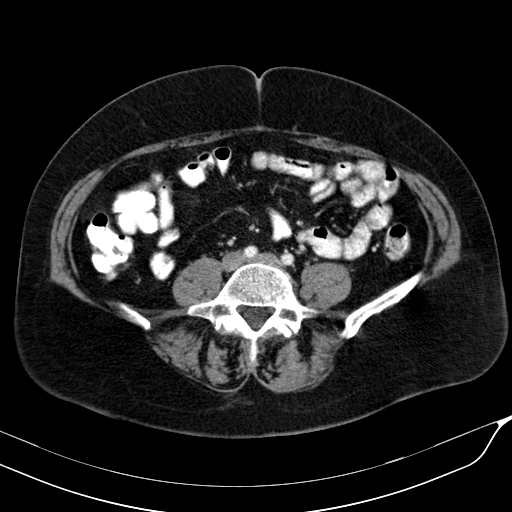
[im 7/88  bone]
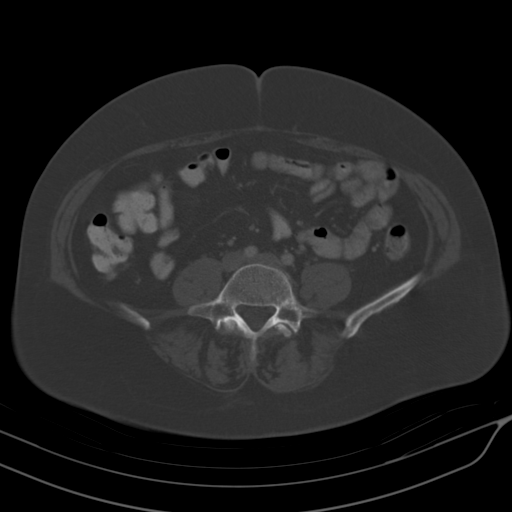
[im 21/88  mediastinal]
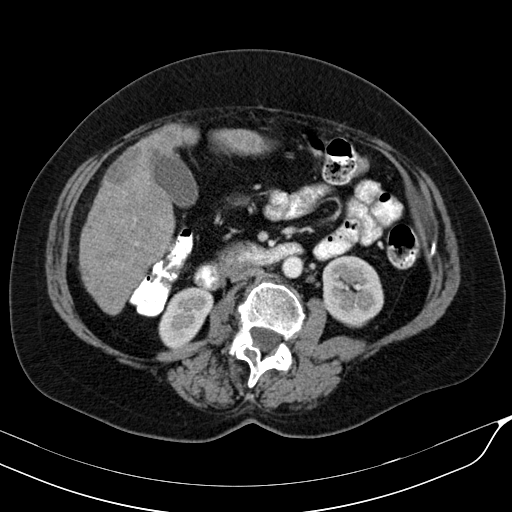
[im 27/88  mediastinal]
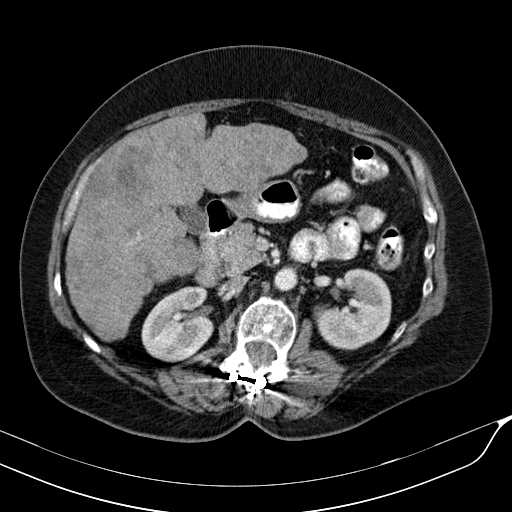
[im 34/88  mediastinal]
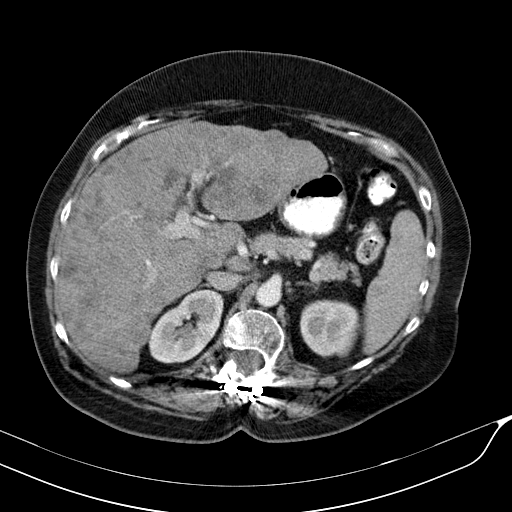
[im 47/88  mediastinal]
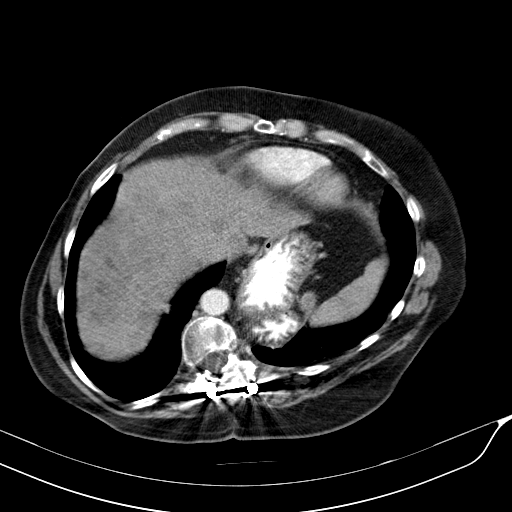
[im 54/88  mediastinal]
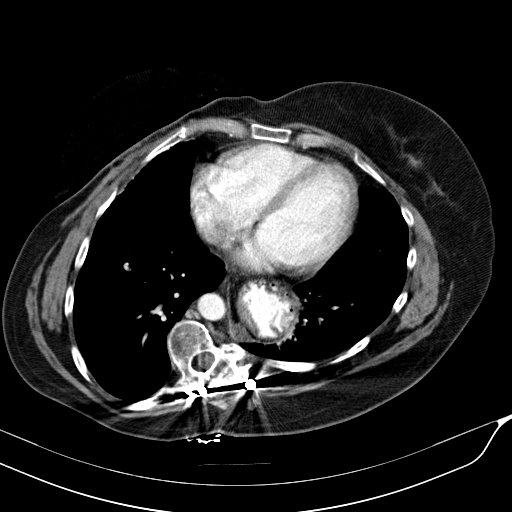
[im 61/88  mediastinal]
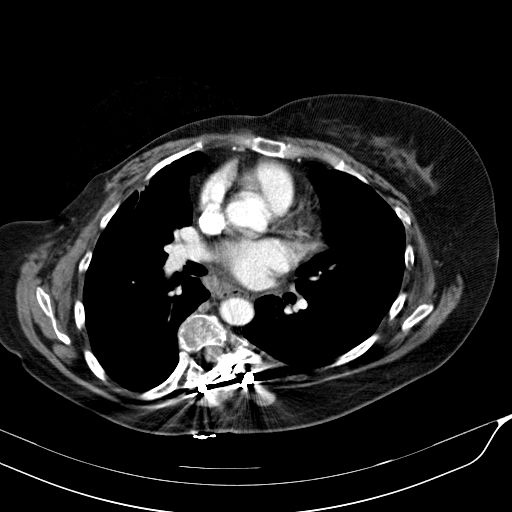
[im 61/88  lung]
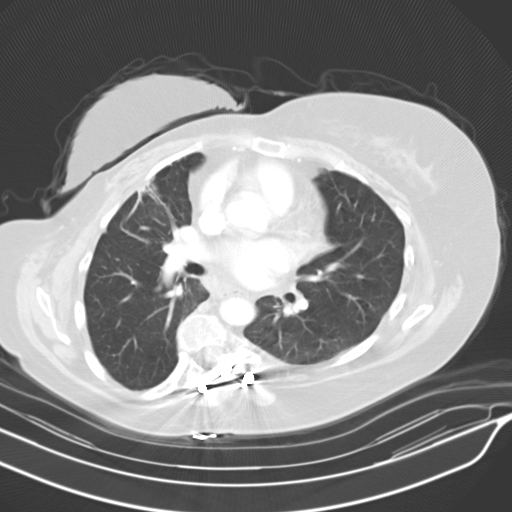
[im 67/88  lung]
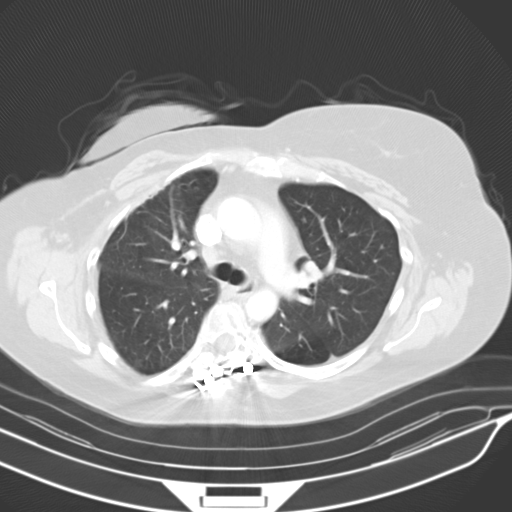
[im 74/88  mediastinal]
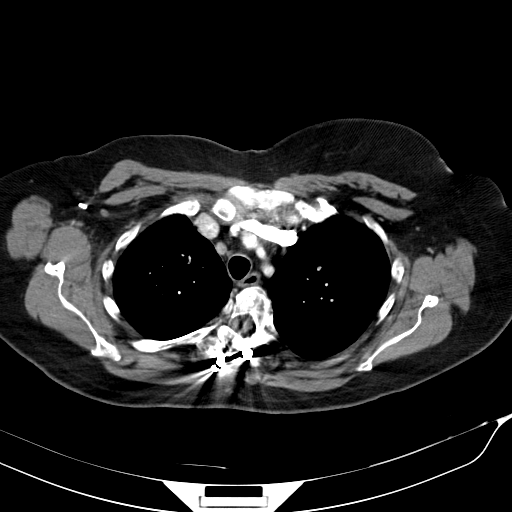
[im 74/88  lung]
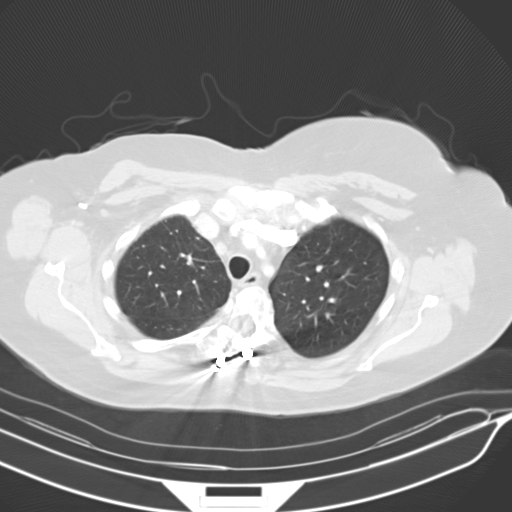
[im 81/88  mediastinal]
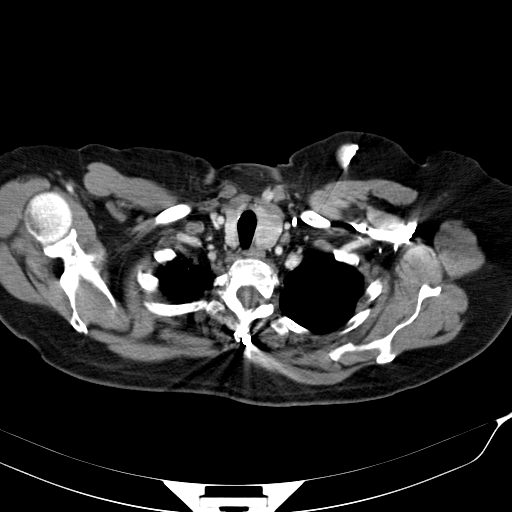
[im 81/88  lung]
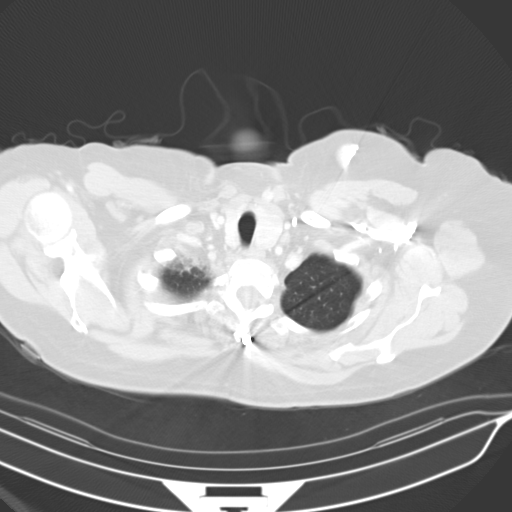
[im 81/88  bone]
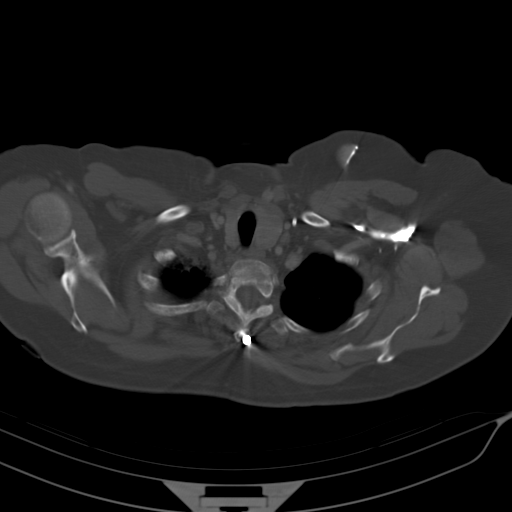

[Series 602: coronals · coronal · 0.85mm/px · 3 of 154 slices shown]
[im 31/154  mediastinal]
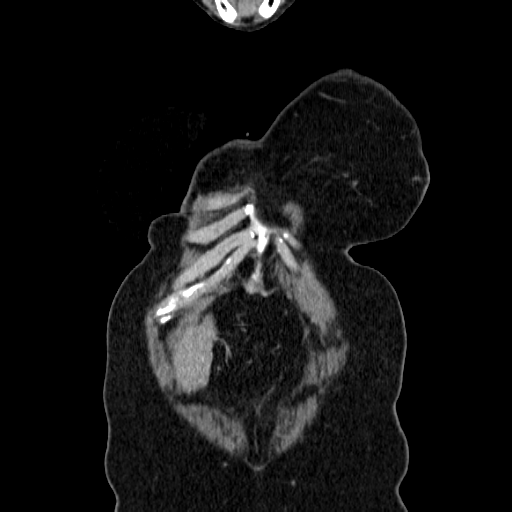
[im 62/154  mediastinal]
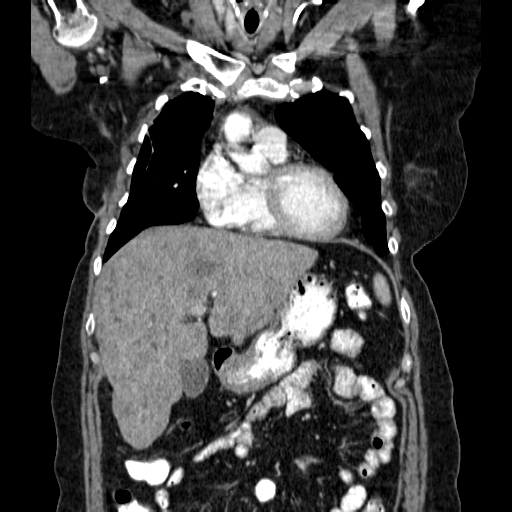
[im 92/154  mediastinal]
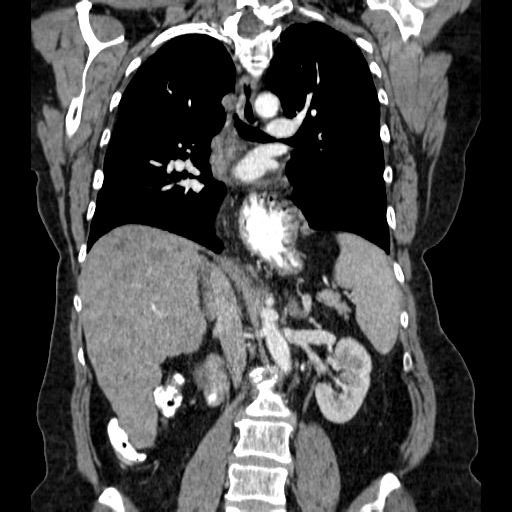

[13 of 36 positions shown; findings below may reference images not displayed]

FINDINGS: CT CHEST FINDINGS

Mediastinum/Hilar Regions: Mild subcarinal lymphadenopathy seen in
the mediastinum measuring 12 mm on image 27 which is new compared to
previous chest CT in 8586. No other sites of mediastinal or hilar
lymphadenopathy identified.

Other Thoracic Lymphadenopathy: None. No axillary lymphadenopathy.
Previous right mastectomy again noted.

Lungs: Right middle lobe scarring is stable. No suspicious pulmonary
nodules or masses are identified. No evidence of acute pulmonary
infiltrate.

Pleura:  No evidence of effusion or mass.

Vascular/Cardiac: No thoracic aortic aneurysm or other significant
abnormality identified.

Musculoskeletal:  No suspicious bone lesions identified.

Other:  Moderate size hiatal hernia again noted.

CT ABDOMEN FINDINGS

Hepatobiliary: Significantly decreased hepatomegaly is seen compared
to prior abdomen CT in 8689. Diffuse hepatic metastases are again
demonstrated. Individual lesions are difficult to measure or compare
with previous study due to their diffuse nature, but many appear
decreased in size.

Pancreas: No mass, inflammatory changes, or other parenchymal
abnormality identified.

Spleen:  Within normal limits in size and appearance.

Adrenal Glands:  No mass identified.

Kidneys:  No masses identified.  No evidence of hydronephrosis.

Lymph Nodes:  No pathologically enlarged lymph nodes identified.

Bowel: Visualized loops within the abdomen are unremarkable.

Vascular:  No evidence of abdominal aortic aneurysm.

Musculoskeletal: No suspicious bone lesions identified. Scoliosis
and posterior spinal fixation rods are again seen.

Other:  None.
IMPRESSION: Decreased hepatomegaly and diffuse liver metastases compared to most
recent abdomen CT of 04/27/2013.

Mild subcarinal mediastinal lymphadenopathy, which appears new since
prior chest CT its 07/27/2012.

Moderate hiatal hernia.
